# Patient Record
Sex: Female | Born: 1940 | Race: White | Hispanic: No | Smoking: Former smoker
Health system: Southern US, Community
[De-identification: ages and names within clinical notes are randomized; demographics above are authoritative.]

## PROBLEM LIST (undated history)

## (undated) DIAGNOSIS — E079 Disorder of thyroid, unspecified: Secondary | ICD-10-CM

## (undated) HISTORY — PX: ABDOMINAL HYSTERECTOMY: SHX81

## (undated) HISTORY — PX: CATARACT EXTRACTION: SUR2

## (undated) HISTORY — PX: FOOT SURGERY: SHX648

---

## 1997-09-19 ENCOUNTER — Encounter: Admission: RE | Admit: 1997-09-19 | Discharge: 1997-12-18 | Payer: Self-pay | Admitting: Anesthesiology

## 1999-11-17 ENCOUNTER — Encounter: Admission: RE | Admit: 1999-11-17 | Discharge: 1999-12-11 | Payer: Self-pay | Admitting: Anesthesiology

## 2004-09-23 ENCOUNTER — Ambulatory Visit: Payer: Self-pay | Admitting: Internal Medicine

## 2005-09-10 ENCOUNTER — Ambulatory Visit: Payer: Self-pay | Admitting: Internal Medicine

## 2005-11-10 ENCOUNTER — Ambulatory Visit: Payer: Self-pay | Admitting: Internal Medicine

## 2008-07-28 ENCOUNTER — Emergency Department: Payer: Self-pay | Admitting: Internal Medicine

## 2009-10-07 ENCOUNTER — Ambulatory Visit: Payer: Self-pay | Admitting: Internal Medicine

## 2009-10-08 ENCOUNTER — Ambulatory Visit: Payer: Self-pay | Admitting: Internal Medicine

## 2009-10-20 ENCOUNTER — Ambulatory Visit: Payer: Self-pay | Admitting: Internal Medicine

## 2009-11-13 ENCOUNTER — Ambulatory Visit: Payer: Self-pay | Admitting: Unknown Physician Specialty

## 2009-11-14 LAB — PATHOLOGY REPORT

## 2009-11-18 ENCOUNTER — Ambulatory Visit: Payer: Self-pay | Admitting: Internal Medicine

## 2011-01-12 ENCOUNTER — Ambulatory Visit: Payer: Self-pay | Admitting: Specialist

## 2011-01-21 ENCOUNTER — Ambulatory Visit: Payer: Self-pay | Admitting: Specialist

## 2011-12-09 ENCOUNTER — Ambulatory Visit: Payer: Self-pay | Admitting: Unknown Physician Specialty

## 2011-12-13 LAB — PATHOLOGY REPORT

## 2013-10-27 ENCOUNTER — Observation Stay: Payer: Self-pay | Admitting: Internal Medicine

## 2013-10-27 LAB — CBC WITH DIFFERENTIAL/PLATELET
BASOS PCT: 1.4 %
Basophil #: 0.1 10*3/uL (ref 0.0–0.1)
EOS PCT: 4.3 %
Eosinophil #: 0.2 10*3/uL (ref 0.0–0.7)
HCT: 38.4 % (ref 35.0–47.0)
HGB: 13.2 g/dL (ref 12.0–16.0)
LYMPHS ABS: 2 10*3/uL (ref 1.0–3.6)
Lymphocyte %: 35.5 %
MCH: 31.8 pg (ref 26.0–34.0)
MCHC: 34.4 g/dL (ref 32.0–36.0)
MCV: 93 fL (ref 80–100)
MONO ABS: 0.6 x10 3/mm (ref 0.2–0.9)
Monocyte %: 9.8 %
NEUTROS ABS: 2.8 10*3/uL (ref 1.4–6.5)
Neutrophil %: 49 %
Platelet: 241 10*3/uL (ref 150–440)
RBC: 4.15 10*6/uL (ref 3.80–5.20)
RDW: 13.5 % (ref 11.5–14.5)
WBC: 5.6 10*3/uL (ref 3.6–11.0)

## 2013-10-27 LAB — COMPREHENSIVE METABOLIC PANEL
ALBUMIN: 3.7 g/dL (ref 3.4–5.0)
ANION GAP: 5 — AB (ref 7–16)
AST: 20 U/L (ref 15–37)
Alkaline Phosphatase: 64 U/L
BUN: 11 mg/dL (ref 7–18)
Bilirubin,Total: 0.3 mg/dL (ref 0.2–1.0)
CALCIUM: 8.9 mg/dL (ref 8.5–10.1)
CO2: 27 mmol/L (ref 21–32)
Chloride: 111 mmol/L — ABNORMAL HIGH (ref 98–107)
Creatinine: 0.84 mg/dL (ref 0.60–1.30)
EGFR (Non-African Amer.): >60
GLUCOSE: 95 mg/dL (ref 65–99)
Osmolality: 284 (ref 275–301)
Potassium: 3.9 mmol/L (ref 3.5–5.1)
SGPT (ALT): 11 U/L — ABNORMAL LOW
SODIUM: 143 mmol/L (ref 136–145)
TOTAL PROTEIN: 6.9 g/dL (ref 6.4–8.2)

## 2013-10-27 LAB — PROTIME-INR
INR: 1.1
PROTHROMBIN TIME: 13.8 s (ref 11.5–14.7)

## 2013-10-27 LAB — URINALYSIS, COMPLETE
BACTERIA: NONE SEEN
Bilirubin,UR: NEGATIVE
Glucose,UR: NEGATIVE mg/dL (ref 0–75)
KETONE: NEGATIVE
NITRITE: NEGATIVE
PH: 6 (ref 4.5–8.0)
Protein: NEGATIVE
RBC,UR: 1 /HPF (ref 0–5)
SPECIFIC GRAVITY: 1.003 (ref 1.003–1.030)
WBC UR: 3 /HPF (ref 0–5)

## 2013-10-27 LAB — APTT: ACTIVATED PTT: 26.5 s (ref 23.6–35.9)

## 2013-10-27 LAB — DIGOXIN LEVEL: Digoxin: <0.06 ng/mL

## 2013-11-21 ENCOUNTER — Ambulatory Visit: Payer: Self-pay | Admitting: Internal Medicine

## 2014-07-20 NOTE — H&P (Signed)
PATIENT NAME:  [name], [name] DATE OF BIRTH:  07/02/1940  DATE OF ADMISSION:  10/27/2013  REFERRING PHYSICIAN: Dorothea GlassmanPaul Soto.   PRIMARY CARE PHYSICIAN: [name].   CHIEF COMPLAINT: Weakness.   HISTORY OF PRESENT ILLNESS: A 74 year old Caucasian female with history of hypothyroidism, GERD, presenting with weakness. Describes acute onset of weakness after watching TV. She states "I felt funny in my head, went to bed, and then I felt funny in my feet too".  At that time she did notice some right-sided weakness with difficulty with repositioning herself in bed, so she called for her son. He noticed her having some slurred speech. All these symptoms have resolved prior to arrival to the Emergency Department. Currently no other complaints. She states she is now in her usual state of health and she was in her usual state of health prior to the onset of the above symptoms.  REVIEW OF SYSTEMS:  CONSTITUTIONAL: Denies fever, fatigue, weakness other than weakness as described above.  EYES: Denies blurred vision, double vision, eye pain.  EARS, NOSE, THROAT: Denies any tinnitus, ear pain or hearing loss. RESPIRATORY: Denies cough, wheeze or shortness of breath.  CARDIOVASCULAR: Denies chest pain, palpitations or edema.  GASTROINTESTINAL: Denies nausea, vomiting, diarrhea, abdominal pain.  GENITOURINARY: Denies dysuria, hematuria.  ENDOCRINE: Denies nocturia or thyroid problems.  HEMATOLOGIC AND LYMPHATIC: Denies easy bruising or bleeding.  SKIN: Denies rash or lesions.  MUSCULOSKELETAL: Denies pain in neck, back, shoulder, knees, hips or arthritic symptoms.  NEUROLOGIC: Positive for paresthesias bilateral feet as well as right-sided weakness as described above and slurred speech. Denies any headache.   PSYCHIATRIC: No anxiety or depressive symptoms.  Otherwise, full review of systems by me is negative.     PAST MEDICAL HISTORY: Hypothyroidism, as well as GERD and Barrett's  esophagitis.   SOCIAL HISTORY: Positive for tobacco use. Denies any alcohol or drug usage.   FAMILY HISTORY: Positive for coronary artery disease.   ALLERGIES: No known drug allergies.   HOME MEDICATIONS: Include Synthroid 100 mcg p.o. daily.   PHYSICAL EXAMINATION:  VITAL SIGNS: Temperature 98.1, heart rate 90, respirations 14, blood pressure 151/65, saturating 96% on room air. Weight 52.8 kg, BMI 20.  GENERAL: Weak-appearing Caucasian female, currently in no acute distress.  HEAD: Normocephalic, atraumatic.  EYES: Pupils equal, round and reactive to light. Extraocular movements are intact.  No scleral icterus.  MOUTH: Moist mucosal membrane. Dentition intact. No abscess noted.  EARS, NOSE AND THROAT: Clear without exudates. No external lesions.  NECK: Supple. No thyromegaly. No nodules. No JVD.  PULMONARY: Clear to auscultation bilaterally without wheezes or rhonchi. No use of accessory muscles. Good respiratory effort.  CHEST: Nontender to palpation.  CARDIOVASCULAR: S1, S2, regular rate and rhythm. No murmurs, rubs, or gallops. No edema. Pedal pulses 2+ bilaterally.  GASTROINTESTINAL: Abdomen soft, nontender, nondistended. No masses. Positive bowel sounds. No hepatosplenomegaly.  MUSCULOSKELETAL: No swelling, clubbing, or edema. Range of motion full in all extremities.  NEUROLOGIC: Cranial nerves II through XII intact. No gross focal neurological deficits. Sensation intact. Reflexes intact. Strength 5/5 in all extremities, including proximal and distal flexion and extension. Pronator drift within normal limits. Gait deferred at this time.  SKIN: No ulceration, lesions, rashes, or cyanosis. Skin warm, dry. Turgor intact.  PSYCHIATRIC: Mood and affect within normal limits. The patient is awake, alert, oriented x 3. Insight and judgment intact.   LABORATORY DATA: CT head performed revealing no acute intracranial process. There is, however, evidence of mild  cerebral atrophy and 7 mm  calcification at the medial aspect of the right sphenoid, likely representing a small meningioma. 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 No acute cardiopulmonary process. Remainder of laboratory data: Sodium 143, potassium 3.9, chloride 111, bicarbonate 27, BUN 11, creatinine 0.84, glucose 95. LFTs within normal limits. WBC 5.6, hemoglobin 13.2, platelets of 241,000.   ASSESSMENT AND PLAN: A 74 year old Caucasian female with history of hypothyroidism, as well as gastroesophageal reflux disease, presenting with weakness.  1. Transient ischemic attack. Admit her to telemetry under observational status. Initiate aspirin and statin therapy, neurology checks q. 4 hours. Check lipid panel, MRI, transthoracic echocardiogram as well as carotid Dopplers in searching for etiology of symptoms and risk factor modification.  2. Hypothyroidism. Continue with home dose of Synthroid.  3. Venous thromboembolism prophylaxis with sequential compression devices.  CODE STATUS: The patient is a full code.   TIME SPENT: 45 minutes.    ____________________________ Virginia Athens. Hower, MD dkh:jh D: 10/27/2013 01:43:25 ET T: 10/27/2013 02:19:13 ET JOB#: [id number]  cc: Virginia Athens. Hower, MD, <Dictator> Virginia Synetta Shadow MD ELECTRONICALLY SIGNED 10/27/2013 20:26

## 2014-07-20 NOTE — Discharge Summary (Signed)
PATIENT NAME:  [name], Virginia L MR#:  [id number] DATE OF BIRTH:  07/02/1940  DATE OF ADMISSION:  10/27/2013 DATE OF DISCHARGE:  10/27/2013  PRIMARY CARE PHYSICIAN: Katherina Rightenny C. Arlana Pouchate, MD.  DISCHARGE DIAGNOSES: 1.  Transient ischemic attack. 2.  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 3.  Tobacco abuse.  4.  Hypothyroidism.   CONDITION: Stable.   CODE STATUS: Full code.   HOME MEDICATIONS: Please refer to the medication reconciliation list.   DIET: Low-fat, low-cholesterol diet.   ACTIVITY: As tolerated.   FOLLOWUP CARE:  Follow up with PCP in 1-2 weeks. The patient needed to get a brain MRI to rule out CVA.  The patient needs to check lipid panel with PCP.   REASON FOR ADMISSION: Weakness.   HOSPITAL COURSE: The patient is a 74 year old Caucasian female with a history of hypothyroidism, GERD, presented to the ED with weakness.  For detailed history and physical examination, please refer to the admission note dictated by Dr. Angelica Ranavid Hower.   On admission date, the patient's CAT scan of head did not show any acute intracranial process, but there is a 7 mm calcification at medial aspect of right sphenoid, likely representing a small meningioma. Chest x-ray shows COPD and emphysema. Laboratory data was unremarkable.  The patient was admitted for TIA to rule out CVA.  After admission, the patient has been treated with aspirin and a statin. The patient got a carotid duplex, which showed bilateral ICA stenosis  less than 50%. The patient refused to get an MRI due to anxiety. The patient wants to go home today. I discussed the patient's condition and plan of treatment, but the patient still wants to go home today.  Her son wants to  get an open MRI in other hospital as outpatient. The patient will get an echo today and then she will be discharged to home today.   The patient has no complaints. She wants to go home. Her vital signs are stable. Discussed the patient's discharge plan  with the patient and the patient's son; otherwise, then to follow up lipid panel with PCP and get MRI of brain, to rule out CVA and continue aspirin and statin.  Discussed with nurse.   TIME SPENT: About 43 minutes.   ____________________________ Shaune PollackQing Najee Cowens, MD qc:ds D: 10/27/2013 13:11:17 ET T: 10/27/2013 16:02:42 ET JOB#: [id number]  cc: Shaune PollackQing Scotty Weigelt, MD, <Dictator> Shaune PollackQING Jeromey Kruer MD ELECTRONICALLY SIGNED 10/27/2013 18:43

## 2016-10-18 ENCOUNTER — Other Ambulatory Visit: Payer: Self-pay | Admitting: Internal Medicine

## 2016-10-18 DIAGNOSIS — R1030 Lower abdominal pain, unspecified: Secondary | ICD-10-CM

## 2016-10-18 DIAGNOSIS — Z90710 Acquired absence of both cervix and uterus: Secondary | ICD-10-CM

## 2016-10-21 ENCOUNTER — Ambulatory Visit
Admission: RE | Admit: 2016-10-21 | Discharge: 2016-10-21 | Disposition: A | Discharge status: Home / Self Care | Payer: Medicare Other | Source: Ambulatory Visit | Attending: Internal Medicine | Admitting: Internal Medicine

## 2016-10-21 DIAGNOSIS — Z90710 Acquired absence of both cervix and uterus: Secondary | ICD-10-CM

## 2016-10-21 DIAGNOSIS — R1030 Lower abdominal pain, unspecified: Secondary | ICD-10-CM | POA: Diagnosis present

## 2016-10-26 ENCOUNTER — Encounter: Payer: Self-pay | Admitting: Emergency Medicine

## 2016-10-26 ENCOUNTER — Emergency Department
Admission: EM | Admit: 2016-10-26 | Discharge: 2016-10-26 | Disposition: A | Discharge status: Home / Self Care | Payer: Medicare Other | Attending: Emergency Medicine | Admitting: Emergency Medicine

## 2016-10-26 DIAGNOSIS — Z79899 Other long term (current) drug therapy: Secondary | ICD-10-CM | POA: Insufficient documentation

## 2016-10-26 DIAGNOSIS — N952 Postmenopausal atrophic vaginitis: Secondary | ICD-10-CM

## 2016-10-26 DIAGNOSIS — R300 Dysuria: Secondary | ICD-10-CM

## 2016-10-26 DIAGNOSIS — F17210 Nicotine dependence, cigarettes, uncomplicated: Secondary | ICD-10-CM | POA: Diagnosis not present

## 2016-10-26 DIAGNOSIS — E039 Hypothyroidism, unspecified: Secondary | ICD-10-CM | POA: Diagnosis not present

## 2016-10-26 HISTORY — DX: Disorder of thyroid, unspecified: E07.9

## 2016-10-26 LAB — URINALYSIS, COMPLETE (UACMP) WITH MICROSCOPIC
Bacteria, UA: NONE SEEN
Bilirubin Urine: NEGATIVE
GLUCOSE, UA: NEGATIVE mg/dL
HGB URINE DIPSTICK: NEGATIVE
Ketones, ur: NEGATIVE mg/dL
NITRITE: NEGATIVE
PH: 5 (ref 5.0–8.0)
Protein, ur: NEGATIVE mg/dL
SPECIFIC GRAVITY, URINE: 1.019 (ref 1.005–1.030)

## 2016-10-26 MED ORDER — ESTRADIOL 10 MCG VA TABS: 1.0000 | ORAL_TABLET | Freq: Every day | VAGINAL | 0 refills | Status: AC

## 2016-10-26 MED ORDER — PHENAZOPYRIDINE HCL 95 MG PO TABS: 95.0000 mg | ORAL_TABLET | Freq: Three times a day (TID) | ORAL | 0 refills | Status: DC | PRN

## 2016-10-26 NOTE — ED Triage Notes (Signed)
Pt with low pelvic and back pain, recently treated for UTI and states is not any better. Has finished antibiotics.

## 2016-10-26 NOTE — ED Provider Notes (Signed)
[facility] Emergency Department Provider Note  ____________________________________________  Time seen: Approximately 3:27 PM  I have reviewed the triage vital signs and the nursing notes.   HISTORY  Chief Complaint Urinary Tract Infection   HPI Rayford HalstedCarole L Nobles is a 76 y.o. female with a history of hypothyroidism who presents for evaluation of dysuria and frequency. Patient reports that she's been having these symptoms ongoing for 3 weeks. 3 weeks ago she went to urgent care and was put on antibiotics. Unfortunately she does not remember which one and we are unable to see the results of her urine culture. She reports that her symptoms never got better. She went to see her primary care doctor last week and he sent her for a transvaginal ultrasound which was normal. No repeat urinalysis was done at that time. Patient continues to have dysuria and frequency. She denies abdominal pain, flank pain, fever or chills, nausea vomiting.  Past Medical History:  Diagnosis Date  . Thyroid disease     There are no active problems to display for this patient.   Past Surgical History:  Procedure Laterality Date  . ABDOMINAL HYSTERECTOMY      Prior to Admission medications   Medication Sig Start Date End Date Taking? Authorizing Provider  Estradiol 10 MCG TABS vaginal tablet Place 1 tablet (10 mcg total) vaginally daily. 10/26/16 11/09/16  Nita SickleVeronese, [place], MD  phenazopyridine (PYRIDIUM) 95 MG tablet Take 1 tablet (95 mg total) by mouth 3 (three) times daily as needed for pain. 10/26/16   Nita SickleVeronese, [place], MD    Allergies Patient has no known allergies.  No family history on file.  Social History Social History  Substance Use Topics  . Smoking status: Current Every Day Smoker  . Smokeless tobacco: Never Used  . Alcohol use Yes    Review of Systems  Constitutional: Negative for fever. Eyes: Negative for visual changes. ENT: Negative for sore  throat. Neck: No neck pain  Cardiovascular: Negative for chest pain. Respiratory: Negative for shortness of breath. Gastrointestinal: Negative for abdominal pain, vomiting or diarrhea. Genitourinary: + dysuria and frequency Musculoskeletal: Negative for back pain. Skin: Negative for rash. Neurological: Negative for headaches, weakness or numbness. Psych: No SI or HI  ____________________________________________   PHYSICAL EXAM:  VITAL SIGNS: ED Triage Vitals  Enc Vitals Group     BP 10/26/16 1502 121/67     Pulse Rate 10/26/16 1502 75     Resp 10/26/16 1502 18     Temp 10/26/16 1502 98.1 F (36.7 C)     Temp Source 10/26/16 1502 Oral     SpO2 10/26/16 1502 97 %     Weight 10/26/16 1503 118 lb (53.5 kg)     Height --      Head Circumference --      Peak Flow --      Pain Score 10/26/16 1502 8     Pain Loc --      Pain Edu? --      Excl. in GC? --     Constitutional: Alert and oriented. Well appearing and in no apparent distress. HEENT:      Head: Normocephalic and atraumatic.         Eyes: Conjunctivae are normal. Sclera is non-icteric.       Mouth/Throat: Mucous membranes are moist.       Neck: Supple with no signs of meningismus. Cardiovascular: Regular rate and rhythm. No murmurs, gallops, or rubs. 2+ symmetrical distal pulses are present  in all extremities. No JVD. Respiratory: Normal respiratory effort. Lungs are clear to auscultation bilaterally. No wheezes, crackles, or rhonchi.  Gastrointestinal: Soft, non tender, and non distended with positive bowel sounds. No rebound or guarding. Genitourinary: No CVA tenderness. Musculoskeletal: Nontender with normal range of motion in all extremities. No edema, cyanosis, or erythema of extremities. Neurologic: Normal speech and language. Face is symmetric. Moving all extremities. No gross focal neurologic deficits are appreciated. Skin: Skin is warm, dry and intact. No rash noted. Psychiatric: Mood and affect are normal.  Speech and behavior are normal.  ____________________________________________   LABS (all labs ordered are listed, but only abnormal results are displayed)  Labs Reviewed  URINALYSIS, COMPLETE (UACMP) WITH MICROSCOPIC - Abnormal; Notable for the following:       Result Value   Color, Urine YELLOW (*)    APPearance CLEAR (*)    Leukocytes, UA TRACE (*)    Squamous Epithelial / LPF 0-5 (*)    All other components within normal limits  URINE CULTURE   ____________________________________________  EKG  none  ____________________________________________  RADIOLOGY   none ____________________________________________   PROCEDURES  Procedure(s) performed: None Procedures Critical Care performed:  None ____________________________________________   INITIAL IMPRESSION / ASSESSMENT AND PLAN / ED COURSE  76 y.o. female with a history of hypothyroidism who presents for evaluation of dysuria and frequency x 3 weeks. Patient has no systemic signs of pyelonephritis. She is extremely well appearing with normal vital signs, no flank tenderness, abdomen is soft with no tenderness throughout. We'll repeat a urinalysis at this time and sent for culture.  _________________________ 3:44 PM on 10/26/2016 -----------------------------------------  UA showing no nitrites, no bacteria, no WBCs. Just trace leukesterase. Urine culture is pending. I do believe the patient's presentation at this time is due to atrophic vaginitis. We'll start patient on a vaginal estrogen, a so, and referral to OB/GYN. Patient refused a vaginal exam at this time but will f/u with Obgyn.     Pertinent labs & imaging results that were available during my care of the patient were reviewed by me and considered in my medical decision making (see chart for details).    ____________________________________________   FINAL CLINICAL IMPRESSION(S) / ED DIAGNOSES  Final diagnoses:  Dysuria  Atrophic vaginitis       NEW MEDICATIONS STARTED DURING THIS VISIT:  New Prescriptions   ESTRADIOL 10 MCG TABS VAGINAL TABLET    Place 1 tablet (10 mcg total) vaginally daily.   PHENAZOPYRIDINE (PYRIDIUM) 95 MG TABLET    Take 1 tablet (95 mg total) by mouth 3 (three) times daily as needed for pain.     Note:  This document was prepared using Dragon voice recognition software and may include unintentional dictation errors.    Don PerkingVeronese, [place], MD 10/26/16 [phone]

## 2016-10-26 NOTE — ED Notes (Signed)
Pt discharged home after verbalizing understanding of discharge instructions; nad noted. 

## 2016-10-26 NOTE — ED Notes (Signed)
Pt presents with continuing UTI symptoms; see triage note. Pt alert & oriented, ready to go. Very anxious to get out of the room.

## 2016-10-28 LAB — URINE CULTURE

## 2016-11-30 ENCOUNTER — Other Ambulatory Visit: Payer: Self-pay | Admitting: Internal Medicine

## 2016-11-30 DIAGNOSIS — R1084 Generalized abdominal pain: Secondary | ICD-10-CM

## 2016-12-03 ENCOUNTER — Ambulatory Visit
Admission: RE | Admit: 2016-12-03 | Discharge: 2016-12-03 | Disposition: A | Discharge status: Home / Self Care | Payer: Medicare Other | Source: Ambulatory Visit | Attending: Internal Medicine | Admitting: Internal Medicine

## 2016-12-03 DIAGNOSIS — R109 Unspecified abdominal pain: Secondary | ICD-10-CM | POA: Diagnosis present

## 2016-12-03 DIAGNOSIS — R1084 Generalized abdominal pain: Secondary | ICD-10-CM

## 2017-08-13 ENCOUNTER — Emergency Department: Payer: Medicare Other

## 2017-08-13 ENCOUNTER — Emergency Department
Admission: EM | Admit: 2017-08-13 | Discharge: 2017-08-14 | Disposition: A | Discharge status: Home / Self Care | Payer: Medicare Other | Attending: Emergency Medicine | Admitting: Emergency Medicine

## 2017-08-13 ENCOUNTER — Other Ambulatory Visit: Payer: Self-pay

## 2017-08-13 DIAGNOSIS — H9202 Otalgia, left ear: Secondary | ICD-10-CM | POA: Insufficient documentation

## 2017-08-13 DIAGNOSIS — R410 Disorientation, unspecified: Secondary | ICD-10-CM | POA: Diagnosis not present

## 2017-08-13 DIAGNOSIS — F17210 Nicotine dependence, cigarettes, uncomplicated: Secondary | ICD-10-CM | POA: Diagnosis not present

## 2017-08-13 DIAGNOSIS — R51 Headache: Secondary | ICD-10-CM | POA: Insufficient documentation

## 2017-08-13 DIAGNOSIS — J029 Acute pharyngitis, unspecified: Secondary | ICD-10-CM | POA: Diagnosis not present

## 2017-08-13 LAB — GROUP A STREP BY PCR: GROUP A STREP BY PCR: NOT DETECTED

## 2017-08-13 MED ORDER — SODIUM CHLORIDE 0.9 % IV BOLUS
500.0000 mL | Freq: Once | INTRAVENOUS | Status: AC
  Administered 2017-08-13: 500 mL via INTRAVENOUS

## 2017-08-13 MED ORDER — AMOXICILLIN-POT CLAVULANATE 875-125 MG PO TABS: 1.0000 | ORAL_TABLET | Freq: Once | ORAL | Status: DC

## 2017-08-13 NOTE — ED Triage Notes (Signed)
Patient reports left ear pain that radiates down into left jaw.  Reports started earlier today.

## 2017-08-14 DIAGNOSIS — H9202 Otalgia, left ear: Secondary | ICD-10-CM | POA: Diagnosis not present

## 2017-08-14 LAB — URINALYSIS, COMPLETE (UACMP) WITH MICROSCOPIC
BILIRUBIN URINE: NEGATIVE
Bacteria, UA: NONE SEEN
GLUCOSE, UA: NEGATIVE mg/dL
KETONES UR: NEGATIVE mg/dL
LEUKOCYTES UA: NEGATIVE
NITRITE: NEGATIVE
Protein, ur: NEGATIVE mg/dL
SPECIFIC GRAVITY, URINE: 1.002 — AB (ref 1.005–1.030)
pH: 7 (ref 5.0–8.0)

## 2017-08-14 LAB — COMPREHENSIVE METABOLIC PANEL
ALBUMIN: 4.1 g/dL (ref 3.5–5.0)
ALK PHOS: 67 U/L (ref 38–126)
ALT: 9 U/L — ABNORMAL LOW (ref 14–54)
ANION GAP: 9 (ref 5–15)
AST: 6 U/L — ABNORMAL LOW (ref 15–41)
BUN: 18 mg/dL (ref 6–20)
CALCIUM: 9.4 mg/dL (ref 8.9–10.3)
CO2: 22 mmol/L (ref 22–32)
Chloride: 103 mmol/L (ref 101–111)
Creatinine, Ser: 0.83 mg/dL (ref 0.44–1.00)
GFR calc Af Amer: >60 mL/min (ref >60)
GFR calc non Af Amer: >60 mL/min (ref >60)
Glucose, Bld: 118 mg/dL — ABNORMAL HIGH (ref 65–99)
POTASSIUM: 3.6 mmol/L (ref 3.5–5.1)
SODIUM: 134 mmol/L — AB (ref 135–145)
TOTAL PROTEIN: 6.6 g/dL (ref 6.5–8.1)
Total Bilirubin: 0.8 mg/dL (ref 0.3–1.2)

## 2017-08-14 LAB — TROPONIN I: Troponin I: <0.03 ng/mL (ref <0.03)

## 2017-08-14 LAB — CBC
HCT: 36.4 % (ref 35.0–47.0)
Hemoglobin: 12.5 g/dL (ref 12.0–16.0)
MCH: 31.4 pg (ref 26.0–34.0)
MCHC: 34.4 g/dL (ref 32.0–36.0)
MCV: 91.2 fL (ref 80.0–100.0)
PLATELETS: 276 10*3/uL (ref 150–440)
RBC: 3.99 MIL/uL (ref 3.80–5.20)
RDW: 14.3 % (ref 11.5–14.5)
WBC: 7.7 10*3/uL (ref 3.6–11.0)

## 2017-08-14 LAB — CK: CK TOTAL: 33 U/L — AB (ref 38–234)

## 2017-08-14 MED ORDER — AZITHROMYCIN 500 MG PO TABS
500.0000 mg | ORAL_TABLET | Freq: Once | ORAL | Status: AC
  Administered 2017-08-14: 500 mg via ORAL
  Filled 2017-08-14: qty 1

## 2017-08-14 MED ORDER — LIDOCAINE HCL (PF) 1 % IJ SOLN
2.0000 mL | Freq: Once | INTRAMUSCULAR | Status: AC
  Administered 2017-08-14: 2 mL
  Filled 2017-08-14: qty 5

## 2017-08-14 MED ORDER — ACETAMINOPHEN 325 MG PO TABS
650.0000 mg | ORAL_TABLET | Freq: Once | ORAL | Status: AC
  Administered 2017-08-14: 650 mg via ORAL
  Filled 2017-08-14: qty 2

## 2017-08-14 MED ORDER — AZITHROMYCIN 250 MG PO TABS: 250.0000 mg | ORAL_TABLET | Freq: Every day | ORAL | 0 refills | Status: DC

## 2017-08-14 MED ORDER — LIDOCAINE VISCOUS HCL 2 % MT SOLN
15.0000 mL | Freq: Once | OROMUCOSAL | Status: AC
  Administered 2017-08-14: 15 mL via OROMUCOSAL
  Filled 2017-08-14: qty 15

## 2017-08-14 NOTE — ED Provider Notes (Addendum)
[facility] Emergency Department Provider Note  ____________________________________________  Time seen: Approximately 12:05 AM  I have reviewed the triage vital signs and the nursing notes.   HISTORY  Chief Complaint Otalgia    HPI [name] is a 77 y.o. female that presents to the emergency department for evaluation of left sided headache, ear pain, jaw pain, difficulty swallowing since this morning.  Two family members have been sick this week with strep throat.  She denies fever, confusion, cough, shortness of breath, chest pain, nausea, vomiting, dysuria.   Past Medical History:  Diagnosis Date  . Thyroid disease     There are no active problems to display for this patient.   Past Surgical History:  Procedure Laterality Date  . ABDOMINAL HYSTERECTOMY      Prior to Admission medications   Medication Sig Start Date End Date Taking? Authorizing Provider  phenazopyridine (PYRIDIUM) 95 MG tablet Take 1 tablet (95 mg total) by mouth 3 (three) times daily as needed for pain. 10/26/16   Nita Sickle, MD    Allergies Patient has no known allergies.  No family history on file.  Social History Social History   Tobacco Use  . Smoking status: Current Every Day Smoker  . Smokeless tobacco: Never Used  Substance Use Topics  . Alcohol use: Yes  . Drug use: No     Review of Systems  Constitutional: No fever/chills ENT: Positive for ear pain. Cardiovascular: No chest pain. Respiratory: No cough. No SOB. Gastrointestinal: No abdominal pain.  No nausea, no vomiting.  Genitourinary: Negative for dysuria. Musculoskeletal: Negative for musculoskeletal pain. Skin: Negative for rash, abrasions, lacerations, ecchymosis. Neurological: Negative for headaches, numbness or tingling   ____________________________________________   PHYSICAL EXAM:  VITAL SIGNS: ED Triage Vitals  Enc Vitals Group     BP 08/13/17 2145 (!) 157/102   Pulse Rate 08/13/17 2145 76     Resp 08/13/17 2145 18     Temp 08/13/17 2145 98.5 F (36.9 C)     Temp Source 08/13/17 2145 Oral     SpO2 08/13/17 2145 96 %     Weight 08/13/17 2146 105 lb (47.6 kg)     Height 08/13/17 2146  (1.6 m)     Head Circumference --      Peak Flow --      Pain Score 08/13/17 2159 6     Pain Loc --      Pain Edu? --      Excl. in GC? --      Constitutional: Alert. Well appearing and in no acute distress. Eyes: Conjunctivae are normal. PERRL. EOMI. Head: Atraumatic. ENT:      Ears: Tympanic membranes are pearly.      Nose: No congestion/rhinnorhea.      Mouth/Throat: Mucous membranes are moist.  Oropharynx erythematous.  Tonsils not enlarged.  Uvula midline. Neck: No stridor.  No bruits. Cardiovascular: Normal rate, regular rhythm.  Good peripheral circulation. Respiratory: Normal respiratory effort without tachypnea or retractions.  Scattered wheezes. Good air entry to the bases with no decreased or absent breath sounds. Gastrointestinal: Bowel sounds 4 quadrants. Soft and nontender to palpation. No guarding or rigidity. No palpable masses. No distention.  Musculoskeletal: Full range of motion to all extremities. No gross deformities appreciated. Neurologic:  Normal speech and language. No gross focal neurologic deficits are appreciated.  Skin:  Skin is warm, dry and intact. No rash noted. Psychiatric: Mood and affect are normal. Patient is repeating stories and  questions multiple times.    ____________________________________________   LABS (all labs ordered are listed, but only abnormal results are displayed)  Labs Reviewed  GROUP A STREP BY PCR  CBC  COMPREHENSIVE METABOLIC PANEL  TROPONIN I  CK  URINALYSIS, COMPLETE (UACMP) WITH MICROSCOPIC   ____________________________________________  EKG  NSR ____________________________________________  RADIOLOGY Lexine Baton, personally viewed and evaluated these images (plain  radiographs) as part of my medical decision making, as well as reviewing the written report by the radiologist.  Dg Chest 2 View  Result Date: 08/13/2017 CLINICAL DATA:  Confusion EXAM: CHEST - 2 VIEW COMPARISON:  10/27/2013 FINDINGS: Hyperinflation. Heart and mediastinal contours are within normal limits. No focal opacities or effusions. No acute bony abnormality. IMPRESSION: Hyperinflation/COPD.  No active cardiopulmonary disease. Electronically Signed   By: Charlett Nose M.D.   On: 08/13/2017 23:37   Ct Head Wo Contrast  Result Date: 08/13/2017 CLINICAL DATA:  Acute onset of left ear pain, radiating to the left jaw. Confusion. EXAM: CT HEAD WITHOUT CONTRAST TECHNIQUE: Contiguous axial images were obtained from the base of the skull through the vertex without intravenous contrast. COMPARISON:  CT of the head performed 10/27/2013, and MRI of the brain performed 11/21/2013 FINDINGS: Brain: No evidence of acute infarction, hemorrhage, hydrocephalus, extra-axial collection or mass lesion / mass effect. Prominence of the ventricles and sulci reflects mild cortical volume loss. The brainstem and fourth ventricle are within normal limits. The basal ganglia are unremarkable in appearance. The cerebral hemispheres demonstrate grossly normal gray-white differentiation. No mass effect or midline shift is seen. Vascular: No hyperdense vessel or unexpected calcification. Skull: There is no evidence of fracture; visualized osseous structures are unremarkable in appearance. Sinuses/Orbits: The visualized portions of the orbits are within normal limits. The paranasal sinuses and mastoid air cells are well-aerated. Other: No significant soft tissue abnormalities are seen. IMPRESSION: 1. No acute intracranial pathology seen on CT. 2. Mild cortical volume loss noted. Electronically Signed   By: Roanna Raider M.D.   On: 08/13/2017 23:36    ____________________________________________    PROCEDURES  Procedure(s)  performed:    Procedures    Medications  sodium chloride 0.9 % bolus 500 mL (500 mLs Intravenous New Bag/Given 08/13/17 2357)     ____________________________________________   INITIAL IMPRESSION / ASSESSMENT AND PLAN / ED COURSE  Pertinent labs & imaging results that were available during my care of the patient were reviewed by me and considered in my medical decision making (see chart for details).  Review of the [place] CSRS was performed in accordance of the [facility] prior to dispensing any controlled drugs.   Patient presents to emergency department for evaluation of left-sided headache, ear pain, jaw pain, painful swallowing for 1 day.  On examination of patient, she repeated questions and statements multiple times. She was not able to repeat back to me our discussions. Son denies any history of dementia or Alzheimer's.  He has not noticed a change in behavior today. Health tech [place] commented that the stories she was telling did not make sense.  CT head, chest x-ray, blood work, urinalysis, strep test was ordered. Patient will be transferred to main for pending tests. Report was given to Dr. Dolores Frame.      ____________________________________________  FINAL CLINICAL IMPRESSION(S) / ED DIAGNOSES  Final diagnoses:  None      NEW MEDICATIONS STARTED DURING THIS VISIT:  ED Discharge Orders    None          This chart was  dictated using voice recognition software/Dragon. Despite best efforts to proofread, errors can occur which can change the meaning. Any change was purely unintentional.    Enid Derry, PA-C 08/14/17 0014    Enid Derry, PA-C 08/14/17 0015

## 2017-08-14 NOTE — Discharge Instructions (Addendum)
1.  Finish antibiotic as prescribed (Azithromycin 250 mg daily x 4 days).  Start your next dose Monday morning. 2.  You may take Tylenol and/or Ibuprofen as needed for discomfort. 3.  Return to the ER for worsening symptoms, persistent vomiting, difficulty breathing or other concerns.

## 2017-08-14 NOTE — ED Provider Notes (Addendum)
-----------------------------------------   1:21 AM on 08/14/2017 -----------------------------------------  Medical screening examination/treatment/procedure(s) were conducted as a shared visit with non-physician practitioner(s) and myself.  I personally evaluated the patient during the encounter. See below.   Updated patient and son on all test results.  Son does not see a change in patient's mentation today.  He is not concerned about her short-term memory loss, neither is she and patient is eager for discharge.  Examined patient's ears; right TM with some cerumen but otherwise within normal limits.  Left TM with mild fluid and white material which looks like scar tissue.  She tells me she only recently started cleaning her ears with Q-tips.  Pain is better in her throat from oral lidocaine.  Will apply 2 drops lidocaine to her left ear for further analgesia, start her on azithromycin for both ear and throat pain.  We discussed risk/benefits of starting her on steroids for eustachian tube dysfunction; they both agreed to hold as I am afraid the risks to her would outweigh the benefits.  Strict return precautions given.  Both verbalize understanding and agree with plan of care.   Irean Hong, MD 08/14/17 1610    Irean Hong, MD 08/21/17 [phone]

## 2017-09-22 ENCOUNTER — Emergency Department
Admission: EM | Admit: 2017-09-22 | Discharge: 2017-09-22 | Disposition: A | Discharge status: Home / Self Care | Payer: Medicare Other | Attending: Emergency Medicine | Admitting: Emergency Medicine

## 2017-09-22 ENCOUNTER — Encounter: Payer: Self-pay | Admitting: Emergency Medicine

## 2017-09-22 ENCOUNTER — Other Ambulatory Visit: Payer: Self-pay

## 2017-09-22 DIAGNOSIS — H6501 Acute serous otitis media, right ear: Secondary | ICD-10-CM | POA: Diagnosis not present

## 2017-09-22 DIAGNOSIS — F17200 Nicotine dependence, unspecified, uncomplicated: Secondary | ICD-10-CM | POA: Diagnosis not present

## 2017-09-22 DIAGNOSIS — H9201 Otalgia, right ear: Secondary | ICD-10-CM | POA: Diagnosis present

## 2017-09-22 MED ORDER — LIDOCAINE VISCOUS HCL 2 % MT SOLN
15.0000 mL | Freq: Once | OROMUCOSAL | Status: AC
  Administered 2017-09-22: 15 mL via OROMUCOSAL
  Filled 2017-09-22: qty 15

## 2017-09-22 MED ORDER — AMOXICILLIN 500 MG PO TABS: 1000.0000 mg | ORAL_TABLET | Freq: Two times a day (BID) | ORAL | 0 refills | Status: AC

## 2017-09-22 NOTE — ED Triage Notes (Signed)
Patient to ER for c/o right sided ear ache. States she was seen in ER recently and given antibiotic for the same on left side. Denies any dizziness or fever.

## 2017-09-22 NOTE — ED Provider Notes (Signed)
[facility] Emergency Department Provider Note  ____________________________________________   First MD Initiated Contact with Patient 09/22/17 [id number]     (approximate)  I have reviewed the triage vital signs and the nursing notes.   HISTORY  Chief Complaint Otalgia   HPI [name] is a 77 y.o. female who self presents to the emergency department with 1 day of right-sided earache.  The pain is mild to moderate throbbing and aching in her right ear.  No fevers or chills.  No neck pain.  No headache.  No discharge.  No trauma.  She did have a possible otitis media in her left ear about a month ago along with a sore throat and was treated with azithromycin.  Nothing seems to make the symptoms better or worse at this point.  Past Medical History:  Diagnosis Date  . Thyroid disease     There are no active problems to display for this patient.   Past Surgical History:  Procedure Laterality Date  . ABDOMINAL HYSTERECTOMY      Prior to Admission medications   Medication Sig Start Date End Date Taking? Authorizing Provider  amoxicillin (AMOXIL) 500 MG tablet Take 2 tablets (1,000 mg total) by mouth 2 (two) times daily for 7 days. 09/22/17 09/29/17  Merrily Brittleifenbark, Kitrina Maurin, MD  azithromycin (ZITHROMAX) 250 MG tablet Take 1 tablet (250 mg total) by mouth daily. 08/14/17   Irean HongSung, Jade J, MD  phenazopyridine (PYRIDIUM) 95 MG tablet Take 1 tablet (95 mg total) by mouth 3 (three) times daily as needed for pain. 10/26/16   Nita SickleVeronese, Snyder, MD    Allergies Patient has no known allergies.  No family history on file.  Social History Social History   Tobacco Use  . Smoking status: Current Every Day Smoker  . Smokeless tobacco: Never Used  Substance Use Topics  . Alcohol use: Yes  . Drug use: No    Review of Systems Constitutional: No fever/chills ENT: Positive for ear pain Cardiovascular: Denies chest pain. Respiratory: Denies shortness of  breath. Gastrointestinal: No abdominal pain.  No nausea, no vomiting.   Neurological: Negative for headaches   ____________________________________________   PHYSICAL EXAM:  VITAL SIGNS: ED Triage Vitals  Enc Vitals Group     BP 09/22/17 0547 (!) 150/64     Pulse Rate 09/22/17 0547 86     Resp 09/22/17 0547 17     Temp 09/22/17 0547 98 F (36.7 C)     Temp Source 09/22/17 0547 Oral     SpO2 09/22/17 0547 96 %     Weight 09/22/17 0549 105 lb (47.6 kg)     Height 09/22/17 0549 5\' 4"  (1.626 m)     Head Circumference --      Peak Flow --      Pain Score --      Pain Loc --      Pain Edu? --      Excl. in GC? --     Constitutional: Pleasant cooperative no acute distress Head: Left-sided tympanic membrane normal right-sided tympanic membrane with slight serous discharge no erythema or bulging.  No mastoid tenderness. Nose: No congestion/rhinnorhea. Mouth/Throat: No trismus no meningismus Neck: No stridor.   Cardiovascular: Regular rate and rhythm Respiratory: Normal respiratory effort.  No retractions. Neurologic:  Normal speech and language. No gross focal neurologic deficits are appreciated.  Skin:  Skin is warm, dry and intact. No rash noted.    ____________________________________________  LABS (all labs ordered are listed, but  only abnormal results are displayed)  Labs Reviewed - No data to display   __________________________________________  EKG   ____________________________________________  RADIOLOGY   ____________________________________________   DIFFERENTIAL includes but not limited to  Acute otitis media, acute otitis externa, meningitis, mastoiditis   PROCEDURES  Procedure(s) performed: no  Procedures  Critical Care performed: no  Observation: no ____________________________________________   INITIAL IMPRESSION / ASSESSMENT AND PLAN / ED COURSE  Pertinent labs & imaging results that were available during my care of the patient  were reviewed by me and considered in my medical decision making (see chart for details).  The patient is hemodynamically stable and quite well-appearing.  She does have serous fluid behind her right ear which could represent an early otitis media.  Given viscous lidocaine in her right ear with improvement in her symptoms.  I will treat her with 7 days of amoxicillin and refer her back to primary care.  The patient verbalizes understanding and agreement with the plan.      ____________________________________________   FINAL CLINICAL IMPRESSION(S) / ED DIAGNOSES  Final diagnoses:  Non-recurrent acute serous otitis media of right ear      NEW MEDICATIONS STARTED DURING THIS VISIT:  Discharge Medication List as of 09/22/2017  6:27 AM    START taking these medications   Details  amoxicillin (AMOXIL) 500 MG tablet Take 2 tablets (1,000 mg total) by mouth 2 (two) times daily for 7 days., Starting Thu 09/22/2017, Until Thu 09/29/2017, Print         Note:  This document was prepared using Dragon voice recognition software and may include unintentional dictation errors.      Merrily Brittle, MD 09/22/17 2240

## 2017-09-22 NOTE — Discharge Instructions (Signed)
It was a pleasure to take care of you today, and thank you for coming to our emergency department.  If you have any questions or concerns before leaving please ask the nurse to grab me and I'm more than happy to go through your aftercare instructions again. ° °If you were prescribed any opioid pain medication today such as Norco, Vicodin, Percocet, morphine, hydrocodone, or oxycodone please make sure you do not drive when you are taking this medication as it can alter your ability to drive safely. ° °If you have any concerns once you are home that you are not improving or are in fact getting worse before you can make it to your follow-up appointment, please do not hesitate to call 911 and come back for further evaluation. ° °Earsie Humm, MD ° ° ° °

## 2017-11-02 DIAGNOSIS — M1990 Unspecified osteoarthritis, unspecified site: Secondary | ICD-10-CM | POA: Insufficient documentation

## 2017-12-23 ENCOUNTER — Emergency Department: Payer: Medicare Other

## 2017-12-23 ENCOUNTER — Encounter: Payer: Self-pay | Admitting: Emergency Medicine

## 2017-12-23 ENCOUNTER — Other Ambulatory Visit: Payer: Self-pay

## 2017-12-23 ENCOUNTER — Emergency Department
Admission: EM | Admit: 2017-12-23 | Discharge: 2017-12-23 | Disposition: A | Discharge status: Home / Self Care | Payer: Medicare Other | Attending: Emergency Medicine | Admitting: Emergency Medicine

## 2017-12-23 DIAGNOSIS — F17200 Nicotine dependence, unspecified, uncomplicated: Secondary | ICD-10-CM | POA: Insufficient documentation

## 2017-12-23 DIAGNOSIS — M545 Low back pain, unspecified: Secondary | ICD-10-CM

## 2017-12-23 DIAGNOSIS — R300 Dysuria: Secondary | ICD-10-CM | POA: Insufficient documentation

## 2017-12-23 DIAGNOSIS — M47816 Spondylosis without myelopathy or radiculopathy, lumbar region: Secondary | ICD-10-CM

## 2017-12-23 LAB — CBC
HCT: 35.6 % (ref 35.0–47.0)
Hemoglobin: 12.5 g/dL (ref 12.0–16.0)
MCH: 31.9 pg (ref 26.0–34.0)
MCHC: 35.1 g/dL (ref 32.0–36.0)
MCV: 90.8 fL (ref 80.0–100.0)
PLATELETS: 264 10*3/uL (ref 150–440)
RBC: 3.92 MIL/uL (ref 3.80–5.20)
RDW: 14.3 % (ref 11.5–14.5)
WBC: 8.1 10*3/uL (ref 3.6–11.0)

## 2017-12-23 LAB — COMPREHENSIVE METABOLIC PANEL
ALT: 8 U/L (ref 0–44)
ANION GAP: 10 (ref 5–15)
AST: 15 U/L (ref 15–41)
Albumin: 4.3 g/dL (ref 3.5–5.0)
Alkaline Phosphatase: 64 U/L (ref 38–126)
BUN: 16 mg/dL (ref 8–23)
CHLORIDE: 101 mmol/L (ref 98–111)
CO2: 24 mmol/L (ref 22–32)
CREATININE: 0.71 mg/dL (ref 0.44–1.00)
Calcium: 9.4 mg/dL (ref 8.9–10.3)
GFR calc Af Amer: >60 mL/min (ref >60)
GFR calc non Af Amer: >60 mL/min (ref >60)
Glucose, Bld: 116 mg/dL — ABNORMAL HIGH (ref 70–99)
Potassium: 3.8 mmol/L (ref 3.5–5.1)
SODIUM: 135 mmol/L (ref 135–145)
Total Bilirubin: 0.6 mg/dL (ref 0.3–1.2)
Total Protein: 6.6 g/dL (ref 6.5–8.1)

## 2017-12-23 LAB — URINALYSIS, COMPLETE (UACMP) WITH MICROSCOPIC
BACTERIA UA: NONE SEEN
BILIRUBIN URINE: NEGATIVE
Glucose, UA: NEGATIVE mg/dL
KETONES UR: NEGATIVE mg/dL
LEUKOCYTES UA: NEGATIVE
NITRITE: NEGATIVE
PH: 6 (ref 5.0–8.0)
PROTEIN: NEGATIVE mg/dL
Specific Gravity, Urine: 1.001 — ABNORMAL LOW (ref 1.005–1.030)

## 2017-12-23 LAB — LIPASE, BLOOD: Lipase: 29 U/L (ref 11–51)

## 2017-12-23 NOTE — ED Notes (Signed)
Pt c/o left and right flank pain that started today with 9/10 pressure and now pt reporting frequency and lower abdominal pressure  Reports no hx of UTI and denies kidney stones or other health problems  After urination att pt reports burning

## 2017-12-23 NOTE — ED Triage Notes (Signed)
Pt comes into the ED via POV c/o lower back pain and painful urination.  Patient states this started last night and then it progressed to today with more discomfort.  Patient ambulatory to triage at this time and has no other complaints.  Patient states she has terrible burning after urination.

## 2017-12-23 NOTE — ED Notes (Signed)
Pt reports abdominal pain att, reports hx of "bowel-line pain"

## 2017-12-23 NOTE — Discharge Instructions (Addendum)
Please take Tylenol 500 mg to 1000 mg every 6 hours as needed for your back pain.  Apply heating pad to the lower back.  Please call your primary care doctor Monday morning to schedule follow-up appointment to review your x-rays, labs and urinalysis.  Return to the emergency department for any increasing pain, fevers, worsening symptoms or urgent changes in your health.

## 2017-12-23 NOTE — ED Provider Notes (Signed)
[facility] EMERGENCY DEPARTMENT Provider Note   CSN: [id number] Arrival date & time: 12/23/17  1848     History   Chief Complaint Chief Complaint  Patient presents with  . Back Pain  . Dysuria    HPI [name] is a 77 y.o. female presents to the emergency department for evaluation of acute lower back pain and dysuria.  Symptoms began today.  She is a difficult historian.  Patient states this morning while sitting she developed aching pain in the lower back that is worse with movement.  She denies any trauma or injury.  Her pain is located along the left than right lower portion of the lumbar spine with no radicular symptoms.  She denies any lower leg discomfort.  She denies any abdominal pain but states she feels pressure in her lower abdomen with urination.  She denies any dysuria or increase in urinary frequency.  She is had normal urinary output with no difficulties voiding.  No blood in her urine.  No vaginal discharge or vaginal bleeding.  She has no history of kidney stones, nausea, vomiting, fevers.  She had a normal bowel movement today.  Patient has been eating and drinking well.  She has not taking any medications for her lower back pain or abdominal pressure, she states her pain is mild with sitting but with standing 5 out of 10.   HPI  Past Medical History:  Diagnosis Date  . Thyroid disease     There are no active problems to display for this patient.   Past Surgical History:  Procedure Laterality Date  . ABDOMINAL HYSTERECTOMY       OB History   None      Home Medications    Prior to Admission medications   Medication Sig Start Date End Date Taking? Authorizing Provider  azithromycin (ZITHROMAX) 250 MG tablet Take 1 tablet (250 mg total) by mouth daily. 08/14/17   Irean Hong, MD  phenazopyridine (PYRIDIUM) 95 MG tablet Take 1 tablet (95 mg total) by mouth 3 (three) times daily as needed for pain. 10/26/16   Nita Sickle,  MD    Family History No family history on file.  Social History Social History   Tobacco Use  . Smoking status: Current Every Day Smoker  . Smokeless tobacco: Never Used  Substance Use Topics  . Alcohol use: Yes  . Drug use: No     Allergies   Patient has no known allergies.   Review of Systems Review of Systems  Constitutional: Negative for activity change, chills, fatigue and fever.  HENT: Negative for congestion, sinus pressure and sore throat.   Eyes: Negative for visual disturbance.  Respiratory: Negative for cough, chest tightness and shortness of breath.   Cardiovascular: Negative for chest pain and leg swelling.  Gastrointestinal: Negative for abdominal pain, diarrhea, nausea and vomiting.  Genitourinary: Positive for pelvic pain (pressue). Negative for difficulty urinating, dysuria, frequency, urgency, vaginal bleeding and vaginal discharge.  Musculoskeletal: Positive for back pain. Negative for arthralgias and gait problem.  Skin: Negative for rash and wound.  Neurological: Negative for weakness, numbness and headaches.  Hematological: Negative for adenopathy.  Psychiatric/Behavioral: Negative for agitation, behavioral problems and confusion.     Physical Exam Updated Vital Signs BP (!) 145/84 (BP Location: Left Arm)   Pulse 78   Temp 98.3 F (36.8 C) (Oral)   Resp 16   Ht 5\' 4"  (1.626 m)   Wt 49.9 kg   SpO2 99%  BMI 18.88 kg/m   Physical Exam  Constitutional: She is oriented to person, place, and time. She appears well-developed and well-nourished. No distress.  HENT:  Head: Normocephalic and atraumatic.  Mouth/Throat: Oropharynx is clear and moist.  Eyes: Pupils are equal, round, and reactive to light. EOM are normal. Right eye exhibits no discharge. Left eye exhibits no discharge.  Neck: Normal range of motion. Neck supple.  Cardiovascular: Normal rate, regular rhythm and intact distal pulses.  Pulmonary/Chest: Effort normal and breath sounds  normal. No respiratory distress. She exhibits no tenderness.  Abdominal: Soft. Bowel sounds are normal. She exhibits no distension. There is no tenderness.  Musculoskeletal: Normal range of motion. She exhibits no edema.  Mild tenderness to the lower back along the spinous process at the lumbosacral junction with mild left and right paravertebral muscle tenderness.  She has good range of motion lumbar spine with only mild discomfort with lumbar flexion.  She is nontender along the SI joints, sacrum, pelvis.  She is full range of motion both hips with no discomfort.  She is nontender along the thoracic spine and no CVA tenderness bilaterally.  She is ambulatory with no antalgic gait.  Neurological: She is alert and oriented to person, place, and time. She has normal reflexes.  Skin: Skin is warm and dry. No rash noted.  Psychiatric: She has a normal mood and affect. Her behavior is normal. Thought content normal.     ED Treatments / Results  Labs (all labs ordered are listed, but only abnormal results are displayed) Labs Reviewed  URINALYSIS, COMPLETE (UACMP) WITH MICROSCOPIC - Abnormal; Notable for the following components:      Result Value   Color, Urine COLORLESS (*)    APPearance CLEAR (*)    Specific Gravity, Urine 1.001 (*)    Hgb urine dipstick SMALL (*)    All other components within normal limits  COMPREHENSIVE METABOLIC PANEL - Abnormal; Notable for the following components:   Glucose, Bld 116 (*)    All other components within normal limits  URINE CULTURE  CBC  LIPASE, BLOOD    EKG None  Radiology Dg Lumbar Spine Complete  Result Date: 12/23/2017 CLINICAL DATA:  Left and right flank pain starting today. EXAM: LUMBAR SPINE - COMPLETE 4+ VIEW COMPARISON:  None. FINDINGS: Five non ribbed lumbar vertebrae in maintained lumbar lordosis. Facet arthropathy with sclerosis and joint space narrowing is identified from L3 through S1. Minimal grade 1 anterolisthesis of L4 on L5. The  bones are demineralized without acute fracture. Degenerative disc disease with moderate disc flattening is identified at L4-5. The included SI joints are congruent. IMPRESSION: 1. No acute fracture. 2. Grade 1 anterolisthesis of L4 on L5 with associated moderate disc flattening. 3. Degenerative facet arthropathy L3 through S1.  No pars defects. Electronically Signed   By: Tollie Eth M.D.   On: 12/23/2017 20:22    Procedures Procedures (including critical care time)  Medications Ordered in ED Medications - No data to display   Initial Impression / Assessment and Plan / ED Course  I have reviewed the triage vital signs and the nursing notes.  Pertinent labs & imaging results that were available during my care of the patient were reviewed by me and considered in my medical decision making (see chart for details).     77 year old female with vague complaints of lower back pain and pressure in her abdomen with urination.  She reported to nursing she had discomfort with burning with urination but denied  any of this with me.  She is able to make urine well with no signs of retention.  Urinalysis was obtained and normal showing no signs of infection.  Small amount of hemoglobin noted in the urine.  CBC and CMP all within normal limits, no elevated white count.  X-rays of the lumbar spine show no evidence of compression fracture.  She was noted to have grade 1 anterior listhesis of L4 on L5 with moderate disc degeneration and lumbar spondylosis.  Patient's vital signs are normal, she is afebrile.  Patient will call PCP Monday to schedule follow-up appointment.  She understands signs symptoms return to ED for.  Final Clinical Impressions(s) / ED Diagnoses   Final diagnoses:  Lumbar spondylosis  Acute midline low back pain without sciatica    ED Discharge Orders    None       Ronnette Juniper 12/23/17 2143    Sharyn Creamer, MD 12/24/17 [phone]

## 2017-12-23 NOTE — ED Notes (Signed)
Peripheral IV discontinued. Catheter intact. No signs of infiltration or redness. Gauze applied to IV site.    Discharge instructions reviewed with patient. Questions fielded by this RN. Patient verbalizes understanding of instructions. Patient discharged home in stable condition per gaines. No acute distress noted at time of discharge.     

## 2017-12-25 LAB — URINE CULTURE: CULTURE: NO GROWTH

## 2018-08-05 ENCOUNTER — Encounter: Payer: Self-pay | Admitting: Emergency Medicine

## 2018-08-05 ENCOUNTER — Emergency Department
Admission: EM | Admit: 2018-08-05 | Discharge: 2018-08-05 | Disposition: A | Discharge status: Home / Self Care | Payer: Medicare Other | Attending: Emergency Medicine | Admitting: Emergency Medicine

## 2018-08-05 ENCOUNTER — Other Ambulatory Visit: Payer: Self-pay

## 2018-08-05 DIAGNOSIS — Z5321 Procedure and treatment not carried out due to patient leaving prior to being seen by health care provider: Secondary | ICD-10-CM | POA: Diagnosis not present

## 2018-08-05 DIAGNOSIS — N390 Urinary tract infection, site not specified: Secondary | ICD-10-CM | POA: Diagnosis present

## 2018-08-05 LAB — URINALYSIS, COMPLETE (UACMP) WITH MICROSCOPIC
Bacteria, UA: NONE SEEN
Bilirubin Urine: NEGATIVE
Glucose, UA: NEGATIVE mg/dL
Ketones, ur: NEGATIVE mg/dL
Leukocytes,Ua: NEGATIVE
Nitrite: NEGATIVE
Protein, ur: NEGATIVE mg/dL
Specific Gravity, Urine: 1.002 — ABNORMAL LOW (ref 1.005–1.030)
pH: 6 (ref 5.0–8.0)

## 2018-08-05 NOTE — ED Triage Notes (Signed)
Patient states that she was seen at urgent care on Sunday and diagnosed with a UTI. Patient states that she finishes the antibiotics today and that she still has burning with urination.

## 2018-08-07 ENCOUNTER — Other Ambulatory Visit: Payer: Self-pay

## 2018-08-07 ENCOUNTER — Encounter: Payer: Self-pay | Admitting: Emergency Medicine

## 2018-08-07 ENCOUNTER — Telehealth: Payer: Self-pay | Admitting: Emergency Medicine

## 2018-08-07 DIAGNOSIS — N390 Urinary tract infection, site not specified: Secondary | ICD-10-CM | POA: Insufficient documentation

## 2018-08-07 DIAGNOSIS — R1030 Lower abdominal pain, unspecified: Secondary | ICD-10-CM | POA: Diagnosis present

## 2018-08-07 DIAGNOSIS — F17200 Nicotine dependence, unspecified, uncomplicated: Secondary | ICD-10-CM | POA: Diagnosis not present

## 2018-08-07 LAB — URINALYSIS, COMPLETE (UACMP) WITH MICROSCOPIC
Bacteria, UA: NONE SEEN
Bilirubin Urine: NEGATIVE
Glucose, UA: NEGATIVE mg/dL
Hgb urine dipstick: NEGATIVE
Ketones, ur: NEGATIVE mg/dL
Leukocytes,Ua: NEGATIVE
Nitrite: POSITIVE — AB
Protein, ur: NEGATIVE mg/dL
Specific Gravity, Urine: 1.001 — ABNORMAL LOW (ref 1.005–1.030)
Squamous Epithelial / LPF: NONE SEEN (ref 0–5)
pH: 6 (ref 5.0–8.0)

## 2018-08-07 LAB — COMPREHENSIVE METABOLIC PANEL
ALT: 11 U/L (ref 0–44)
AST: 17 U/L (ref 15–41)
Albumin: 4.7 g/dL (ref 3.5–5.0)
Alkaline Phosphatase: 69 U/L (ref 38–126)
Anion gap: 12 (ref 5–15)
BUN: 11 mg/dL (ref 8–23)
CO2: 22 mmol/L (ref 22–32)
Calcium: 9.2 mg/dL (ref 8.9–10.3)
Chloride: 102 mmol/L (ref 98–111)
Creatinine, Ser: 1.02 mg/dL — ABNORMAL HIGH (ref 0.44–1.00)
GFR calc Af Amer: >60 mL/min (ref >60)
GFR calc non Af Amer: 52 mL/min — ABNORMAL LOW (ref >60)
Glucose, Bld: 106 mg/dL — ABNORMAL HIGH (ref 70–99)
Potassium: 3.6 mmol/L (ref 3.5–5.1)
Sodium: 136 mmol/L (ref 135–145)
Total Bilirubin: 0.5 mg/dL (ref 0.3–1.2)
Total Protein: 6.9 g/dL (ref 6.5–8.1)

## 2018-08-07 LAB — CBC
HCT: 40.8 % (ref 36.0–46.0)
Hemoglobin: 13.8 g/dL (ref 12.0–15.0)
MCH: 30.6 pg (ref 26.0–34.0)
MCHC: 33.8 g/dL (ref 30.0–36.0)
MCV: 90.5 fL (ref 80.0–100.0)
Platelets: 272 10*3/uL (ref 150–400)
RBC: 4.51 MIL/uL (ref 3.87–5.11)
RDW: 13.3 % (ref 11.5–15.5)
WBC: 7.1 10*3/uL (ref 4.0–10.5)
nRBC: 0 % (ref 0.0–0.2)

## 2018-08-07 NOTE — Telephone Encounter (Signed)
Called patient due to lwot to inquire about condition and follow up plans. Left message.   

## 2018-08-07 NOTE — ED Triage Notes (Signed)
Pt presents to ED with burning with urination and lower abd pain. Dx last Sunday with uti and finished antibiotic Saturday and was having continued painful urination. Pt states she went back to urgent care this past Sunday and got additional antibiotics but is still not feeling any better.

## 2018-08-08 ENCOUNTER — Emergency Department
Admission: EM | Admit: 2018-08-08 | Discharge: 2018-08-08 | Disposition: A | Discharge status: Home / Self Care | Payer: Medicare Other | Attending: Emergency Medicine | Admitting: Emergency Medicine

## 2018-08-08 ENCOUNTER — Encounter: Payer: Self-pay | Admitting: Radiology

## 2018-08-08 ENCOUNTER — Emergency Department: Payer: Medicare Other

## 2018-08-08 DIAGNOSIS — N390 Urinary tract infection, site not specified: Secondary | ICD-10-CM

## 2018-08-08 DIAGNOSIS — R109 Unspecified abdominal pain: Secondary | ICD-10-CM

## 2018-08-08 MED ORDER — LORAZEPAM 2 MG/ML IJ SOLN
1.0000 mg | Freq: Once | INTRAMUSCULAR | Status: AC
  Administered 2018-08-08: 03:00:00 1 mg via INTRAVENOUS
  Filled 2018-08-08: qty 1

## 2018-08-08 MED ORDER — IOHEXOL 300 MG/ML  SOLN
75.0000 mL | Freq: Once | INTRAMUSCULAR | Status: AC | PRN
  Administered 2018-08-08: 75 mL via INTRAVENOUS

## 2018-08-08 NOTE — ED Notes (Signed)
Called son that mom is discharged and he will drive down to [place]. He is aware that she had medication that has made her very tired and he needs to help her get in to house and to bed

## 2018-08-08 NOTE — Discharge Instructions (Addendum)
Please seek medical attention for any high fevers, chest pain, shortness of breath, change in behavior, persistent vomiting, bloody stool or any other new or concerning symptoms.  

## 2018-08-08 NOTE — ED Provider Notes (Signed)
[facility] Emergency Department Provider Note    ____________________________________________   I have reviewed the triage vital signs and the nursing notes.   HISTORY  Chief Complaint Abdominal Pain   History limited by: Not Limited   HPI [name] is a 78 y.o. female who presents to the emergency department today because of concern for continued lower abdominal pain. She describes it as burning. It has been present for the past three days. Went to urgent care and was prescribed antibiotics and pyridium. Has been taking bother for over 24 hours with continued symptoms. The patient states that the burning is now moving up her stomach. She denies any fevers. No nausea or vomiting.   Records reviewed. Per medical record review patient has a history of thyroid disease.  Past Medical History:  Diagnosis Date  . Thyroid disease     There are no active problems to display for this patient.   Past Surgical History:  Procedure Laterality Date  . ABDOMINAL HYSTERECTOMY    . FOOT SURGERY      Prior to Admission medications   Medication Sig Start Date End Date Taking? Authorizing Provider  azithromycin (ZITHROMAX) 250 MG tablet Take 1 tablet (250 mg total) by mouth daily. 08/14/17   Irean HongSung, Jade J, MD  phenazopyridine (PYRIDIUM) 95 MG tablet Take 1 tablet (95 mg total) by mouth 3 (three) times daily as needed for pain. 10/26/16   Nita SickleVeronese, [place], MD    Allergies Patient has no known allergies.  No family history on file.  Social History Social History   Tobacco Use  . Smoking status: Current Every Day Smoker  . Smokeless tobacco: Never Used  Substance Use Topics  . Alcohol use: Not Currently  . Drug use: No    Review of Systems Constitutional: No fever/chills Eyes: No visual changes. ENT: No sore throat. Cardiovascular: Denies chest pain. Respiratory: Denies shortness of breath. Gastrointestinal: Positive for abdominal pain.   Genitourinary: Negative for dysuria. Musculoskeletal: Negative for back pain. Skin: Negative for rash. Neurological: Negative for headaches, focal weakness or numbness.  ____________________________________________   PHYSICAL EXAM:  VITAL SIGNS: ED Triage Vitals  Enc Vitals Group     BP 08/07/18 2134 (!) 142/67     Pulse Rate 08/07/18 2134 77     Resp 08/07/18 2134 18     Temp 08/07/18 2134 97.9 F (36.6 C)     Temp Source 08/07/18 2134 Oral     SpO2 08/07/18 2134 99 %     Weight 08/07/18 2135 105 lb (47.6 kg)     Height 08/07/18 2135 5\' 4"  (1.626 m)   Constitutional: Alert and oriented.  Eyes: Conjunctivae are normal.  ENT      Head: Normocephalic and atraumatic.      Nose: No congestion/rhinnorhea.      Mouth/Throat: Mucous membranes are moist.      Neck: No stridor. Hematological/Lymphatic/Immunilogical: No cervical lymphadenopathy. Cardiovascular: Normal rate, regular rhythm.  No murmurs, rubs, or gallops.  Respiratory: Normal respiratory effort without tachypnea nor retractions. Breath sounds are clear and equal bilaterally. No wheezes/rales/rhonchi. Gastrointestinal: Soft and tender to palpation in the lower abdomen. No rebound. No guarding.  Genitourinary: Deferred Musculoskeletal: Normal range of motion in all extremities. No lower extremity edema. Neurologic:  Normal speech and language. No gross focal neurologic deficits are appreciated.  Skin:  Skin is warm, dry and intact. No rash noted. Psychiatric: Mood and affect are normal. Speech and behavior are normal. Patient exhibits appropriate insight  and judgment.  ____________________________________________    LABS (pertinent positives/negatives)  UA positive nitrite, 0-5 wbc CMP wnl except glu 106, cr 1.02 CBC wbc 7.1, hgb 13.8, plt 272  ____________________________________________   EKG  None  ____________________________________________    RADIOLOGY  CT abd/pel Possible  enteritis  ____________________________________________   PROCEDURES  Procedures  ____________________________________________   INITIAL IMPRESSION / ASSESSMENT AND PLAN / ED COURSE  Pertinent labs & imaging results that were available during my care of the patient were reviewed by me and considered in my medical decision making (see chart for details).   Patient presented to the emergency department today with continued lower abdominal burning. Has had treatment for UTI for slightly over 1 hour. CT scan was obtained given she did have some tenderness to lower abdomen. This only showed some possible enteritis. Patient denies any change in bowel. At this time think likely artifactual finding on the ct scan. Do think this could still be related to UTI. Will send urine culture.   ____________________________________________   FINAL CLINICAL IMPRESSION(S) / ED DIAGNOSES  Final diagnoses:  Lower urinary tract infectious disease  Abdominal pain, unspecified abdominal location     Note: This dictation was prepared with Dragon dictation. Any transcriptional errors that result from this process are unintentional     Phineas Semen, MD 08/08/18 [phone]

## 2018-08-09 LAB — URINE CULTURE: Culture: NO GROWTH

## 2018-08-21 ENCOUNTER — Other Ambulatory Visit: Payer: Self-pay

## 2018-08-21 ENCOUNTER — Emergency Department
Admission: EM | Admit: 2018-08-21 | Discharge: 2018-08-22 | Disposition: A | Discharge status: Home / Self Care | Payer: Medicare Other | Attending: Emergency Medicine | Admitting: Emergency Medicine

## 2018-08-21 ENCOUNTER — Encounter: Payer: Self-pay | Admitting: Emergency Medicine

## 2018-08-21 ENCOUNTER — Emergency Department: Payer: Medicare Other

## 2018-08-21 DIAGNOSIS — R6884 Jaw pain: Secondary | ICD-10-CM | POA: Diagnosis present

## 2018-08-21 DIAGNOSIS — M542 Cervicalgia: Secondary | ICD-10-CM | POA: Diagnosis not present

## 2018-08-21 DIAGNOSIS — F17210 Nicotine dependence, cigarettes, uncomplicated: Secondary | ICD-10-CM | POA: Insufficient documentation

## 2018-08-21 NOTE — ED Triage Notes (Signed)
Pt c/o bilateral ear pain x1 day. Pt has ENT appointment next month but unable to get relief before so. Pt denies drainage as well as fever.

## 2018-08-21 NOTE — ED Provider Notes (Signed)
[facility] Emergency Department Provider Note  ____________________________________________  Time seen: Approximately 11:32 PM  I have reviewed the triage vital signs and the nursing notes.   HISTORY  Chief Complaint Otalgia    HPI [name] is a 78 y.o. female presents to the emergency department with left-sided jaw pain and neck pain that is "tender to the touch" that has been present for the past 2 days.  Patient states that it has been hard for her to swallow and is observed using throat lozenges in the emergency department.  Patient states that she has not been eating as much due to pain.  Patient states that she  had an ear infection several years ago and she thinks that might be happening again.  Patient seems very disoriented and is telling me that she is trying to find a doctor who bruised her hip several weeks ago.  She denies fever, chills, cough, chest tightness or chest pain.  Patient states that she has been alone in her home with her son and daughter in law since the pandemic started and states that she thinks this is why she feels so bad. No alleviating measures have been attempted.         Past Medical History:  Diagnosis Date  . Thyroid disease     There are no active problems to display for this patient.   Past Surgical History:  Procedure Laterality Date  . ABDOMINAL HYSTERECTOMY    . FOOT SURGERY      Prior to Admission medications   Medication Sig Start Date End Date Taking? Authorizing Provider  levothyroxine (SYNTHROID) 100 MCG tablet Take 100 mcg by mouth daily before breakfast.   Yes [provider]  azithromycin (ZITHROMAX) 250 MG tablet Take 1 tablet (250 mg total) by mouth daily. 08/14/17   Irean HongSung, Jade J, MD  phenazopyridine (PYRIDIUM) 95 MG tablet Take 1 tablet (95 mg total) by mouth 3 (three) times daily as needed for pain. 10/26/16   Nita SickleVeronese, Holiday, MD    Allergies Patient has no known  allergies.  History reviewed. No pertinent family history.  Social History Social History   Tobacco Use  . Smoking status: Current Every Day Smoker  . Smokeless tobacco: Never Used  Substance Use Topics  . Alcohol use: Not Currently  . Drug use: No     Review of Systems  Constitutional: No fever/chills Eyes: No visual changes. No discharge ENT: Patient has neck pain and left sided jaw pain.  Cardiovascular: no chest pain. Respiratory: no cough. No SOB. Gastrointestinal: No abdominal pain.  No nausea, no vomiting.  No diarrhea.  No constipation. Genitourinary: Negative for dysuria. No hematuria. Musculoskeletal: Negative for musculoskeletal pain. Skin: Negative for rash, abrasions, lacerations, ecchymosis. Neurological: Negative for headaches, focal weakness or numbness.   ____________________________________________   PHYSICAL EXAM:  VITAL SIGNS: ED Triage Vitals [08/21/18 2245]  Enc Vitals Group     BP (!) 162/64     Pulse Rate 77     Resp 19     Temp 98 F (36.7 C)     Temp Source Oral     SpO2 98 %     Weight      Height      Head Circumference      Peak Flow      Pain Score      Pain Loc      Pain Edu?      Excl. in GC?  Constitutional: Alert and oriented. Well appearing and in no acute distress. Eyes: Conjunctivae are normal. PERRL. EOMI. Head: Atraumatic. ENT:      Ears: Patient has cloudy fluid visualized in left middle ear but no erythema.       Nose: No congestion/rhinnorhea.      Mouth/Throat: Mucous membranes are moist.  Neck: No stridor.  Patient has tenderness to palpation over left submandibular neck. Hematological/Lymphatic/Immunilogical: No cervical lymphadenopathy. Cardiovascular: Normal rate, regular rhythm. Normal S1 and S2.  Good peripheral circulation. Respiratory: Normal respiratory effort without tachypnea or retractions. Lungs CTAB. Good air entry to the bases with no decreased or absent breath sounds. Gastrointestinal:  Bowel sounds 4 quadrants. Soft and nontender to palpation. No guarding or rigidity. No palpable masses. No distention. No CVA tenderness. Musculoskeletal: Full range of motion to all extremities. No gross deformities appreciated. Neurologic:  Normal speech and language. No gross focal neurologic deficits are appreciated.  Skin:  Skin is warm, dry and intact. No rash noted. Psychiatric: Mood and affect are normal. Speech and behavior are normal. Patient exhibits appropriate insight and judgement.   ____________________________________________   LABS (all labs ordered are listed, but only abnormal results are displayed)  Labs Reviewed  CBC WITH DIFFERENTIAL/PLATELET  COMPREHENSIVE METABOLIC PANEL  URINALYSIS, COMPLETE (UACMP) WITH MICROSCOPIC  TROPONIN I  LACTIC ACID, PLASMA  LACTIC ACID, PLASMA   ____________________________________________  EKG   ____________________________________________  RADIOLOGY     No results found.  ____________________________________________    PROCEDURES  Procedure(s) performed:    Procedures    Medications - No data to display   ____________________________________________   INITIAL IMPRESSION / ASSESSMENT AND PLAN / ED COURSE  Pertinent labs & imaging results that were available during my care of the patient were reviewed by me and considered in my medical decision making (see chart for details).  Review of the [place] CSRS was performed in accordance of the [facility] prior to dispensing any controlled drugs.           Assessment and Plan:  Jaw pain Neck pain 78 year old female presents to the emergency department with left-sided jaw pain and neck pain for the past 2 days.  Patient states that it is been hard to eat/swallow due to pain and discomfort.  On physical exam, patient's history changes several times.  She seems disoriented and confused.  She has some cloudy fluid visualized behind left TM but no signs of otitis media.   She has reproducible tenderness to palpation along left submandibular neck.  Differential diagnosis included STEMI, sialoadenitis, parotitis, deep neck abscess, trigeminal neuralgia and dental abscess...  Work-up is pending at this time. Patient care was turned over to attending, Dr. Derrill Kay.    ____________________________________________  FINAL CLINICAL IMPRESSION(S) / ED DIAGNOSES  Final diagnoses:  Neck pain      NEW MEDICATIONS STARTED DURING THIS VISIT:  ED Discharge Orders    None          This chart was dictated using voice recognition software/Dragon. Despite best efforts to proofread, errors can occur which can change the meaning. Any change was purely unintentional.    Orvil Feil, PA-C 08/21/18 2359    Minna Antis, MD 08/22/18 2241

## 2018-08-22 ENCOUNTER — Emergency Department: Payer: Medicare Other

## 2018-08-22 ENCOUNTER — Encounter: Payer: Self-pay | Admitting: Radiology

## 2018-08-22 DIAGNOSIS — M542 Cervicalgia: Secondary | ICD-10-CM | POA: Diagnosis not present

## 2018-08-22 LAB — CBC WITH DIFFERENTIAL/PLATELET
Abs Immature Granulocytes: 0.03 10*3/uL (ref 0.00–0.07)
Basophils Absolute: 0 10*3/uL (ref 0.0–0.1)
Basophils Relative: 1 %
Eosinophils Absolute: 0.2 10*3/uL (ref 0.0–0.5)
Eosinophils Relative: 3 %
HCT: 35 % — ABNORMAL LOW (ref 36.0–46.0)
Hemoglobin: 11.5 g/dL — ABNORMAL LOW (ref 12.0–15.0)
Immature Granulocytes: 1 %
Lymphocytes Relative: 18 %
Lymphs Abs: 1.1 10*3/uL (ref 0.7–4.0)
MCH: 30.2 pg (ref 26.0–34.0)
MCHC: 32.9 g/dL (ref 30.0–36.0)
MCV: 91.9 fL (ref 80.0–100.0)
Monocytes Absolute: 0.5 10*3/uL (ref 0.1–1.0)
Monocytes Relative: 9 %
Neutro Abs: 4.1 10*3/uL (ref 1.7–7.7)
Neutrophils Relative %: 68 %
Platelets: 277 10*3/uL (ref 150–400)
RBC: 3.81 MIL/uL — ABNORMAL LOW (ref 3.87–5.11)
RDW: 13.2 % (ref 11.5–15.5)
WBC: 5.9 10*3/uL (ref 4.0–10.5)
nRBC: 0 % (ref 0.0–0.2)

## 2018-08-22 LAB — COMPREHENSIVE METABOLIC PANEL
ALT: 10 U/L (ref 0–44)
AST: 13 U/L — ABNORMAL LOW (ref 15–41)
Albumin: 3.9 g/dL (ref 3.5–5.0)
Alkaline Phosphatase: 70 U/L (ref 38–126)
Anion gap: 10 (ref 5–15)
BUN: 10 mg/dL (ref 8–23)
CO2: 24 mmol/L (ref 22–32)
Calcium: 9 mg/dL (ref 8.9–10.3)
Chloride: 106 mmol/L (ref 98–111)
Creatinine, Ser: 0.8 mg/dL (ref 0.44–1.00)
GFR calc Af Amer: >60 mL/min (ref >60)
GFR calc non Af Amer: >60 mL/min (ref >60)
Glucose, Bld: 123 mg/dL — ABNORMAL HIGH (ref 70–99)
Potassium: 4 mmol/L (ref 3.5–5.1)
Sodium: 140 mmol/L (ref 135–145)
Total Bilirubin: 0.6 mg/dL (ref 0.3–1.2)
Total Protein: 6.4 g/dL — ABNORMAL LOW (ref 6.5–8.1)

## 2018-08-22 LAB — TROPONIN I: Troponin I: <0.03 ng/mL (ref <0.03)

## 2018-08-22 LAB — LACTIC ACID, PLASMA: Lactic Acid, Venous: 0.9 mmol/L (ref 0.5–1.9)

## 2018-08-22 MED ORDER — IOHEXOL 300 MG/ML  SOLN
75.0000 mL | Freq: Once | INTRAMUSCULAR | Status: AC | PRN
  Administered 2018-08-22: 75 mL via INTRAVENOUS

## 2018-08-22 MED ORDER — IOPAMIDOL (ISOVUE-300) INJECTION 61%: 75.0000 mL | Freq: Once | INTRAVENOUS | Status: DC | PRN

## 2018-08-22 MED ORDER — LORAZEPAM 2 MG/ML IJ SOLN
INTRAMUSCULAR | Status: AC
  Filled 2018-08-22: qty 1

## 2018-08-22 MED ORDER — LORAZEPAM 2 MG/ML IJ SOLN
1.0000 mg | Freq: Once | INTRAMUSCULAR | Status: AC
  Administered 2018-08-22: 1 mg via INTRAVENOUS

## 2018-08-22 NOTE — ED Notes (Signed)
Patient transported to CT 

## 2018-08-22 NOTE — ED Provider Notes (Signed)
Patient work up without any obvious etiology of the patient's symptoms or concerning findings. When I went to discuss with the patient she stated that she felt ready to go home. States she has an appointment scheduled next month with ENT.    Phineas Semen, MD 08/22/18 [phone]

## 2018-08-22 NOTE — Discharge Instructions (Addendum)
Please seek medical attention for any high fevers, chest pain, shortness of breath, change in behavior, persistent vomiting, bloody stool or any other new or concerning symptoms.  

## 2018-09-01 ENCOUNTER — Encounter: Payer: Self-pay | Admitting: *Deleted

## 2018-09-01 ENCOUNTER — Emergency Department
Admission: EM | Admit: 2018-09-01 | Discharge: 2018-09-01 | Disposition: A | Discharge status: Home / Self Care | Payer: Medicare Other | Attending: Emergency Medicine | Admitting: Emergency Medicine

## 2018-09-01 ENCOUNTER — Other Ambulatory Visit: Payer: Self-pay

## 2018-09-01 DIAGNOSIS — F17200 Nicotine dependence, unspecified, uncomplicated: Secondary | ICD-10-CM | POA: Diagnosis not present

## 2018-09-01 DIAGNOSIS — Z79899 Other long term (current) drug therapy: Secondary | ICD-10-CM | POA: Insufficient documentation

## 2018-09-01 DIAGNOSIS — R21 Rash and other nonspecific skin eruption: Secondary | ICD-10-CM | POA: Insufficient documentation

## 2018-09-01 LAB — CBC WITH DIFFERENTIAL/PLATELET
Abs Immature Granulocytes: 0.02 10*3/uL (ref 0.00–0.07)
Basophils Absolute: 0.1 10*3/uL (ref 0.0–0.1)
Basophils Relative: 1 %
Eosinophils Absolute: 0.1 10*3/uL (ref 0.0–0.5)
Eosinophils Relative: 2 %
HCT: 35.6 % — ABNORMAL LOW (ref 36.0–46.0)
Hemoglobin: 11.9 g/dL — ABNORMAL LOW (ref 12.0–15.0)
Immature Granulocytes: 0 %
Lymphocytes Relative: 20 %
Lymphs Abs: 0.9 10*3/uL (ref 0.7–4.0)
MCH: 30.8 pg (ref 26.0–34.0)
MCHC: 33.4 g/dL (ref 30.0–36.0)
MCV: 92.2 fL (ref 80.0–100.0)
Monocytes Absolute: 0.4 10*3/uL (ref 0.1–1.0)
Monocytes Relative: 8 %
Neutro Abs: 3.3 10*3/uL (ref 1.7–7.7)
Neutrophils Relative %: 69 %
Platelets: 222 10*3/uL (ref 150–400)
RBC: 3.86 MIL/uL — ABNORMAL LOW (ref 3.87–5.11)
RDW: 13.4 % (ref 11.5–15.5)
WBC: 4.7 10*3/uL (ref 4.0–10.5)
nRBC: 0 % (ref 0.0–0.2)

## 2018-09-01 LAB — COMPREHENSIVE METABOLIC PANEL
ALT: 10 U/L (ref 0–44)
AST: 14 U/L — ABNORMAL LOW (ref 15–41)
Albumin: 4.1 g/dL (ref 3.5–5.0)
Alkaline Phosphatase: 66 U/L (ref 38–126)
Anion gap: 10 (ref 5–15)
BUN: 11 mg/dL (ref 8–23)
CO2: 24 mmol/L (ref 22–32)
Calcium: 9 mg/dL (ref 8.9–10.3)
Chloride: 104 mmol/L (ref 98–111)
Creatinine, Ser: 0.83 mg/dL (ref 0.44–1.00)
GFR calc Af Amer: >60 mL/min (ref >60)
GFR calc non Af Amer: >60 mL/min (ref >60)
Glucose, Bld: 122 mg/dL — ABNORMAL HIGH (ref 70–99)
Potassium: 3.6 mmol/L (ref 3.5–5.1)
Sodium: 138 mmol/L (ref 135–145)
Total Bilirubin: 0.7 mg/dL (ref 0.3–1.2)
Total Protein: 6.3 g/dL — ABNORMAL LOW (ref 6.5–8.1)

## 2018-09-01 LAB — PROTIME-INR
INR: 1.1 (ref 0.8–1.2)
Prothrombin Time: 14.2 seconds (ref 11.4–15.2)

## 2018-09-01 LAB — APTT: aPTT: 28 seconds (ref 24–36)

## 2018-09-01 MED ORDER — PREDNISONE 20 MG PO TABS: 20.0000 mg | ORAL_TABLET | Freq: Two times a day (BID) | ORAL | 0 refills | Status: AC

## 2018-09-01 MED ORDER — DIPHENHYDRAMINE-ZINC ACETATE 2-0.1 % EX CREA: 1.0000 "application " | TOPICAL_CREAM | Freq: Three times a day (TID) | CUTANEOUS | 0 refills | Status: DC | PRN

## 2018-09-01 MED ORDER — FAMOTIDINE 20 MG PO TABS: 20.0000 mg | ORAL_TABLET | Freq: Every day | ORAL | 0 refills | Status: DC

## 2018-09-01 MED ORDER — PREDNISONE 20 MG PO TABS
60.0000 mg | ORAL_TABLET | Freq: Once | ORAL | Status: AC
  Administered 2018-09-01: 60 mg via ORAL
  Filled 2018-09-01: qty 3

## 2018-09-01 MED ORDER — FAMOTIDINE 20 MG PO TABS
20.0000 mg | ORAL_TABLET | Freq: Once | ORAL | Status: AC
  Administered 2018-09-01: 20 mg via ORAL
  Filled 2018-09-01: qty 1

## 2018-09-01 NOTE — ED Notes (Signed)
See triage note  Presents with possible allergic reaction  States she went to see ENT yesterday  Was told that she did not have an ear problem  Was told to take IBU otc  States she took some couple of hours PTA  Now has a slight rash to abd

## 2018-09-01 NOTE — ED Triage Notes (Addendum)
Pt to ED reporting she took ibuprofen 2 hours ago and has since been having an allergic reaction. Rash noted to pts abd. No SOB or swelling in her throat reported. Pt talking to staff without difficulty. Pt denies ever having taken ibuprofen in the past.

## 2018-09-01 NOTE — ED Provider Notes (Signed)
[facility] Emergency Department Provider Note  ____________________________________________  Time seen: Approximately 3:14 PM  I have reviewed the triage vital signs and the nursing notes.   HISTORY  Chief Complaint Allergic Reaction    HPI [name] is a 78 y.o. female presents to the emergency department with a new petechial rash of the abdomen and upper extremities after taking ibuprofen.  Patient states that she went to see her ENT physician yesterday and was diagnosed with TMJ type pain.  Patient reports that physician recommended ibuprofen.  Patient states that she has never taken ibuprofen in her life.  Patient noticed rash developing after 1 dose of ibuprofen.  Patient states that rash is mildly pruritic.  She denies shortness of breath, difficulty swallowing, chest tightness, nausea, vomiting or diarrhea.  No syncope.  Patient states that she has never experienced similar symptoms in the past.  No other alleviating measures have been attempted.        Past Medical History:  Diagnosis Date  . Thyroid disease     There are no active problems to display for this patient.   Past Surgical History:  Procedure Laterality Date  . ABDOMINAL HYSTERECTOMY    . FOOT SURGERY      Prior to Admission medications   Medication Sig Start Date End Date Taking? Authorizing Provider  diphenhydrAMINE-zinc acetate (BENADRYL EXTRA STRENGTH) cream Apply 1 application topically 3 (three) times daily as needed for itching. 09/01/18   Orvil Feil, PA-C  famotidine (PEPCID) 20 MG tablet Take 1 tablet (20 mg total) by mouth daily for 3 days. 09/01/18 09/04/18  Orvil Feil, PA-C  levothyroxine (SYNTHROID) 100 MCG tablet Take 100 mcg by mouth daily before breakfast.    [provider]  predniSONE (DELTASONE) 20 MG tablet Take 1 tablet (20 mg total) by mouth 2 (two) times a day for 3 days. 09/01/18 09/04/18  Orvil Feil, PA-C    Allergies Patient has no  known allergies.  History reviewed. No pertinent family history.  Social History Social History   Tobacco Use  . Smoking status: Current Every Day Smoker  . Smokeless tobacco: Never Used  Substance Use Topics  . Alcohol use: Not Currently  . Drug use: No     Review of Systems  Constitutional: No fever/chills Eyes: No visual changes. No discharge ENT: No upper respiratory complaints. Cardiovascular: no chest pain. Respiratory: no cough. No SOB. Gastrointestinal: No abdominal pain.  No nausea, no vomiting.  No diarrhea.  No constipation. Genitourinary: Negative for dysuria. No hematuria Musculoskeletal: Negative for musculoskeletal pain. Skin: Patient has rash.  Neurological: Negative for headaches, focal weakness or numbness.   ____________________________________________   PHYSICAL EXAM:  VITAL SIGNS: ED Triage Vitals  Enc Vitals Group     BP 09/01/18 1441 (!) 141/54     Pulse Rate 09/01/18 1441 84     Resp 09/01/18 1441 16     Temp 09/01/18 1441 98.2 F (36.8 C)     Temp Source 09/01/18 1441 Oral     SpO2 09/01/18 1441 98 %     Weight 09/01/18 1442 104 lb 15 oz (47.6 kg)     Height 09/01/18 1442 5\' 4"  (1.626 m)     Head Circumference --      Peak Flow --      Pain Score 09/01/18 1442 0     Pain Loc --      Pain Edu? --      Excl. in GC? --  Constitutional: Alert and oriented. Well appearing and in no acute distress. Eyes: Conjunctivae are normal. PERRL. EOMI. Head: Atraumatic. Cardiovascular: Normal rate, regular rhythm. Normal S1 and S2.  Good peripheral circulation. Respiratory: Normal respiratory effort without tachypnea or retractions. Lungs CTAB. Good air entry to the bases with no decreased or absent breath sounds. Gastrointestinal: Bowel sounds 4 quadrants. Soft and nontender to palpation. No guarding or rigidity. No palpable masses. No distention. No CVA tenderness. Musculoskeletal: Full range of motion to all extremities. No gross  deformities appreciated. Neurologic:  Normal speech and language. No gross focal neurologic deficits are appreciated.  Skin: Patient has macular, petechial-like rash of abdomen and forearms.  Rash is in an irregular distribution.  Bruising along forearms is also visualized.  No signs of excoriation. Psychiatric: Mood and affect are normal. Speech and behavior are normal. Patient exhibits appropriate insight and judgement.   ____________________________________________   LABS (all labs ordered are listed, but only abnormal results are displayed)  Labs Reviewed  CBC WITH DIFFERENTIAL/PLATELET - Abnormal; Notable for the following components:      Result Value   RBC 3.86 (*)    Hemoglobin 11.9 (*)    HCT 35.6 (*)    All other components within normal limits  COMPREHENSIVE METABOLIC PANEL - Abnormal; Notable for the following components:   Glucose, Bld 122 (*)    Total Protein 6.3 (*)    AST 14 (*)    All other components within normal limits  APTT  PROTIME-INR   ____________________________________________  EKG   ____________________________________________  RADIOLOGY   No results found.  ____________________________________________    PROCEDURES  Procedure(s) performed:    Procedures    Medications  predniSONE (DELTASONE) tablet 60 mg (60 mg Oral Given 09/01/18 1613)  famotidine (PEPCID) tablet 20 mg (20 mg Oral Given 09/01/18 1613)     ____________________________________________   INITIAL IMPRESSION / ASSESSMENT AND PLAN / ED COURSE  Pertinent labs & imaging results that were available during my care of the patient were reviewed by me and considered in my medical decision making (see chart for details).  Review of the [place] CSRS was performed in accordance of the [facility] prior to dispensing any controlled drugs.           Assessment and Plan:  Rash:  78 year old female presents to the emergency department with a new onset macular, pruritic rash of the  abdomen and forearms that started today after she took ibuprofen  On physical exam, rash is visualized.  Patient appeared comfortable.  Vital signs were reassuring in the emergency department.  Differential diagnosis included drug rash, thrombocytopenia and Henoch-Schnlein purpura..  Patient's platelets were within reference range on CBC.  Renal function was reassuring.  PT-INR and APTT were within reference range.  Patient was given 60 mg of prednisone in the emergency department and Pepcid.  She was discharged with 40 mg of prednisone daily for the next 3 days, 20 mg of Pepcid for the next 3 days and topical Benadryl for pruritus.  Strict return precautions were given to return to the emergency department for new or worsening symptoms.  Advised patient to stop taking ibuprofen and to take Tylenol for her TMJ type pain.  She voiced understanding.  All patient questions were answered.   ____________________________________________  FINAL CLINICAL IMPRESSION(S) / ED DIAGNOSES  Final diagnoses:  Rash      NEW MEDICATIONS STARTED DURING THIS VISIT:  ED Discharge Orders         Ordered  predniSONE (DELTASONE) 20 MG tablet  2 times daily     09/01/18 1618    famotidine (PEPCID) 20 MG tablet  Daily     09/01/18 1618    diphenhydrAMINE-zinc acetate (BENADRYL EXTRA STRENGTH) cream  3 times daily PRN     09/01/18 1618              This chart was dictated using voice recognition software/Dragon. Despite best efforts to proofread, errors can occur which can change the meaning. Any change was purely unintentional.    Orvil FeilWoods, Josecarlos Harriott M, PA-C 09/01/18 1623    Phineas SemenGoodman, [place], MD 09/01/18 [phone]

## 2018-09-19 DIAGNOSIS — R1313 Dysphagia, pharyngeal phase: Secondary | ICD-10-CM | POA: Insufficient documentation

## 2018-09-19 NOTE — Progress Notes (Signed)
 Otolaryngology Clinic Note  HPI:    [name] is a 78 y.o. female who presents as a new patient with a chief complaint of swallow problem.  She complains of difficulty swallowing.  She has to swallow small amounts.  She has no problem swallowing liquids.  Swallowing saliva takes more effort.  There is no throat pain.  Her swallow problem feels moderate.  Her doctor treated her with Pepcid  that seemed to help while she was taking it.  The problem came back thereafter.  She has lost a few pounds recently.  PMH/Meds/All/SocHx/FamHx/ROS:   Past Medical History:  Diagnosis Date  . Hypothyroid     Past Surgical History:  Procedure Laterality Date  . HYSTERECTOMY      No family history of bleeding disorders, wound healing problems or difficulty with anesthesia.   Social History   Socioeconomic History  . Marital status: Not on file    Spouse name: Not on file  . Number of children: Not on file  . Years of education: Not on file  . Highest education level: Not on file  Occupational History  . Not on file  Social Needs  . Financial resource strain: Not on file  . Food insecurity    Worry: Not on file    Inability: Not on file  . Transportation needs    Medical: Not on file    Non-medical: Not on file  Tobacco Use  . Smoking status: Current Every Day Smoker  . Smokeless tobacco: Never Used  Substance and Sexual Activity  . Alcohol use: Never    Frequency: Never  . Drug use: Not on file  . Sexual activity: Not on file  Lifestyle  . Physical activity    Days per week: Not on file    Minutes per session: Not on file  . Stress: Not on file  Relationships  . Social Musician on phone: Not on file    Gets together: Not on file    Attends religious service: Not on file    Active member of club or organization: Not on file    Attends meetings of clubs or organizations: Not on file    Relationship status: Not on file  Other Topics Concern  . Not on file   Social History Narrative  . Not on file     Current Outpatient Medications:  .  SYNTHROID  100 mcg tablet, TAKE 1 TABLET BY MOUTH EVERY MORNING, Disp: , Rfl:  .  famotidine  (PEPCID ) 20 MG tablet, TK 1 T PO QD FOR 3 DAYS, Disp: , Rfl:   A complete ROS was performed with pertinent positives/negatives noted in the HPI. The remainder of the ROS are negative.    Physical Exam:    BP 140/74   Ht 1.626 m (5' 4)   Wt 46.3 kg (102 lb)   BMI 17.51 kg/m   Appearance: alert, NAD, pleasant and cooperative Ability to communicate: normal voice Head and face: no skin lesions, no visible masses, normal structures Palpation of face: no masses, tenderness, or deformity Eyes: extra ocular movements intact, pupils equally round and reactive to light, no nystagmus Left ear: external ear normal, external auditory canal without substantial cerumen, tympanic membrane intact, middle ear aerated Right ear: external ear normal, external auditory canal without substantial cerumen, tympanic membrane intact, middle ear aerated Hearing: some difficulty understanding normal conversational speech Nose: external nose normal, septum relatively midline, mild inferior turbinate hypertrophy; no lesions, masses, polyps or exudates  Lips, teeth, gums: no lesions, edentulous with upper and lower dentures, healthy gums Oropharynx: mucosa without ulcers, masses or leukoplakia, normal tongue, tonsils 1+; pharyngeal walls normal Mirror exam of larynx: no mass or ulceration, vocal cords symmetrically mobile Mirror exam of nasopharynx: no mass or ulceration Neck: no mass or tenderness, normal neck landmarks Thyroid : no thyromegaly or mass Lymphatics: no cervical adenopathy Salivary glands: soft, symmetric, no masses Facial strength: normal and symmetric CN II-XII intact Respiratory: breath sounds symmetric and clear Cardiovascular: regular rate and rhythm  Independent Review of Additional Tests or Records:   None  Procedures:  None   Impression & Plans:  [name] is a 78 y.o. female with pharyngeal dysphagia.  - We discussed her problem at length.  It seems that reflux treatment improved her swallow difficulty.  Before committing to longer term therapy, I recommended we evaluate her swallow with a barium swallow.  I will call her with results.  Vaughan Ricker, MD Otolaryngology    Electronically signed by: Vaughan Alm Ricker, MD 09/19/18 1002

## 2018-10-07 ENCOUNTER — Emergency Department: Payer: Medicare Other

## 2018-10-07 ENCOUNTER — Encounter: Payer: Self-pay | Admitting: Emergency Medicine

## 2018-10-07 ENCOUNTER — Other Ambulatory Visit: Payer: Self-pay

## 2018-10-07 ENCOUNTER — Emergency Department
Admission: EM | Admit: 2018-10-07 | Discharge: 2018-10-07 | Disposition: A | Discharge status: Home / Self Care | Payer: Medicare Other | Attending: Emergency Medicine | Admitting: Emergency Medicine

## 2018-10-07 DIAGNOSIS — E079 Disorder of thyroid, unspecified: Secondary | ICD-10-CM | POA: Diagnosis not present

## 2018-10-07 DIAGNOSIS — F17210 Nicotine dependence, cigarettes, uncomplicated: Secondary | ICD-10-CM | POA: Diagnosis not present

## 2018-10-07 DIAGNOSIS — R1032 Left lower quadrant pain: Secondary | ICD-10-CM | POA: Insufficient documentation

## 2018-10-07 DIAGNOSIS — R102 Pelvic and perineal pain: Secondary | ICD-10-CM | POA: Diagnosis not present

## 2018-10-07 DIAGNOSIS — Z79899 Other long term (current) drug therapy: Secondary | ICD-10-CM | POA: Diagnosis not present

## 2018-10-07 DIAGNOSIS — R1030 Lower abdominal pain, unspecified: Secondary | ICD-10-CM

## 2018-10-07 LAB — URINALYSIS, COMPLETE (UACMP) WITH MICROSCOPIC
Bilirubin Urine: NEGATIVE
Glucose, UA: NEGATIVE mg/dL
Hgb urine dipstick: NEGATIVE
Ketones, ur: NEGATIVE mg/dL
Leukocytes,Ua: NEGATIVE
Nitrite: NEGATIVE
Protein, ur: NEGATIVE mg/dL
Specific Gravity, Urine: 1.003 — ABNORMAL LOW (ref 1.005–1.030)
pH: 7 (ref 5.0–8.0)

## 2018-10-07 LAB — CBC
HCT: 42.6 % (ref 36.0–46.0)
Hemoglobin: 13.4 g/dL (ref 12.0–15.0)
MCH: 29.9 pg (ref 26.0–34.0)
MCHC: 31.5 g/dL (ref 30.0–36.0)
MCV: 95.1 fL (ref 80.0–100.0)
Platelets: 258 10*3/uL (ref 150–400)
RBC: 4.48 MIL/uL (ref 3.87–5.11)
RDW: 13.2 % (ref 11.5–15.5)
WBC: 5.3 10*3/uL (ref 4.0–10.5)
nRBC: 0 % (ref 0.0–0.2)

## 2018-10-07 LAB — COMPREHENSIVE METABOLIC PANEL
ALT: 13 U/L (ref 0–44)
AST: 18 U/L (ref 15–41)
Albumin: 4.6 g/dL (ref 3.5–5.0)
Alkaline Phosphatase: 61 U/L (ref 38–126)
Anion gap: 11 (ref 5–15)
BUN: 9 mg/dL (ref 8–23)
CO2: 26 mmol/L (ref 22–32)
Calcium: 10 mg/dL (ref 8.9–10.3)
Chloride: 105 mmol/L (ref 98–111)
Creatinine, Ser: 0.81 mg/dL (ref 0.44–1.00)
GFR calc Af Amer: >60 mL/min (ref >60)
GFR calc non Af Amer: >60 mL/min (ref >60)
Glucose, Bld: 117 mg/dL — ABNORMAL HIGH (ref 70–99)
Potassium: 3.7 mmol/L (ref 3.5–5.1)
Sodium: 142 mmol/L (ref 135–145)
Total Bilirubin: 0.7 mg/dL (ref 0.3–1.2)
Total Protein: 7.4 g/dL (ref 6.5–8.1)

## 2018-10-07 LAB — LIPASE, BLOOD: Lipase: 33 U/L (ref 11–51)

## 2018-10-07 MED ORDER — SODIUM CHLORIDE 0.9% FLUSH: 3.0000 mL | Freq: Once | INTRAVENOUS | Status: DC

## 2018-10-07 MED ORDER — IOHEXOL 300 MG/ML  SOLN
75.0000 mL | Freq: Once | INTRAMUSCULAR | Status: AC | PRN
  Administered 2018-10-07: 75 mL via INTRAVENOUS

## 2018-10-07 NOTE — ED Triage Notes (Signed)
No BM x 2 weeks. Abdominal pain x 1 month.

## 2018-10-07 NOTE — ED Notes (Signed)
IV access attempted x 1 by this RN 

## 2018-10-07 NOTE — ED Provider Notes (Signed)
[facility] Emergency Department Provider Note   ____________________________________________    I have reviewed the triage vital signs and the nursing notes.   HISTORY  Chief Complaint Abdominal Pain     HPI [name] is a 78 y.o. female who presents with complaints of lower abdominal pain.  Pain is most prominent in the left lower quadrant and suprapubically.  She reports is been ongoing for several days, she was referred from urgent care for evaluation here.  She does report some constipation as well.  Denies fevers or chills.  No nausea or vomiting.  Has had a hysterectomy no other abdominal surgeries.  No dysuria.  No back pain.  No dizziness.  Past Medical History:  Diagnosis Date  . Thyroid disease     There are no active problems to display for this patient.   Past Surgical History:  Procedure Laterality Date  . ABDOMINAL HYSTERECTOMY    . FOOT SURGERY      Prior to Admission medications   Medication Sig Start Date End Date Taking? Authorizing Provider  diphenhydrAMINE-zinc acetate (BENADRYL EXTRA STRENGTH) cream Apply 1 application topically 3 (three) times daily as needed for itching. 09/01/18   Orvil FeilWoods, Jaclyn M, PA-C  famotidine (PEPCID) 20 MG tablet Take 1 tablet (20 mg total) by mouth daily for 3 days. 09/01/18 09/04/18  Orvil FeilWoods, Jaclyn M, PA-C  levothyroxine (SYNTHROID) 100 MCG tablet Take 100 mcg by mouth daily before breakfast.    [provider]     Allergies Patient has no known allergies.  No family history on file.  Social History Social History   Tobacco Use  . Smoking status: Current Every Day Smoker  . Smokeless tobacco: Never Used  Substance Use Topics  . Alcohol use: Not Currently  . Drug use: No    Review of Systems  Constitutional: No fever/chills Eyes: No visual changes.  ENT: No sore throat. Cardiovascular: Denies chest pain. Respiratory: Denies shortness of breath. Gastrointestinal: As  above Genitourinary: As above Musculoskeletal: Negative for back pain. Skin: Negative for rash. Neurological: Negative for headaches or weakness   ____________________________________________   PHYSICAL EXAM:  VITAL SIGNS: ED Triage Vitals  Enc Vitals Group     BP 10/07/18 0950 (!) 156/52     Pulse Rate 10/07/18 0950 66     Resp 10/07/18 0950 18     Temp 10/07/18 0950 98.2 F (36.8 C)     Temp Source 10/07/18 0950 Oral     SpO2 10/07/18 0950 100 %     Weight 10/07/18 0951 44.5 kg (98 lb)     Height 10/07/18 0951 1.626 m (5\' 4" )     Head Circumference --      Peak Flow --      Pain Score 10/07/18 0951 10     Pain Loc --      Pain Edu? --      Excl. in GC? --     Constitutional: Alert and oriented.  Eyes: Conjunctivae are normal.  Head: Atraumatic. Nose: No congestion/rhinnorhea. Mouth/Throat: Mucous membranes are moist.   Neck:  Painless ROM Cardiovascular: Normal rate, regular rhythm. Grossly normal heart sounds.  Good peripheral circulation. Respiratory: Normal respiratory effort.  No retractions. Lungs CTAB. Gastrointestinal: Tenderness palpation the left lower quadrant, no distention, no CVA tenderness  Musculoskeletal:  Warm and well perfused Neurologic:  Normal speech and language. No gross focal neurologic deficits are appreciated.  Skin:  Skin is warm, dry and intact. No rash  noted. Psychiatric: Mood and affect are normal. Speech and behavior are normal.  ____________________________________________   LABS (all labs ordered are listed, but only abnormal results are displayed)  Labs Reviewed  COMPREHENSIVE METABOLIC PANEL - Abnormal; Notable for the following components:      Result Value   Glucose, Bld 117 (*)    All other components within normal limits  URINALYSIS, COMPLETE (UACMP) WITH MICROSCOPIC - Abnormal; Notable for the following components:   Color, Urine YELLOW (*)    APPearance CLEAR (*)    Specific Gravity, Urine 1.003 (*)    Bacteria,  UA RARE (*)    All other components within normal limits  LIPASE, BLOOD  CBC   ____________________________________________  EKG  None ____________________________________________  RADIOLOGY  CT abdomen pelvis no acute distress ____________________________________________   PROCEDURES  Procedure(s) performed: No  Procedures   Critical Care performed: No ____________________________________________   INITIAL IMPRESSION / ASSESSMENT AND PLAN / ED COURSE  Pertinent labs & imaging results that were available during my care of the patient were reviewed by me and considered in my medical decision making (see chart for details).  Patient presents with left lower quadrant abdominal pain, she is tender in this area, lab work is overall unremarkable, suspicious for diverticulitis, will obtain imaging and reevaluate.  CT imaging is reassuring, no evidence of diverticulitis, lab work, urinalysis unremarkable as well.  Patient is appropriate for discharge at this time as she states she is feeling well and is anxious to leave.  Recommend close follow-up with PCP, return precautions discussed   ____________________________________________   FINAL CLINICAL IMPRESSION(S) / ED DIAGNOSES  Final diagnoses:  Lower abdominal pain        Note:  This document was prepared using Dragon voice recognition software and may include unintentional dictation errors.   Lavonia Drafts, MD 10/07/18 1242

## 2019-03-30 ENCOUNTER — Emergency Department
Admission: EM | Admit: 2019-03-30 | Discharge: 2019-03-30 | Disposition: A | Discharge status: Home / Self Care | Payer: Medicare Other | Attending: Emergency Medicine | Admitting: Emergency Medicine

## 2019-03-30 ENCOUNTER — Encounter: Payer: Self-pay | Admitting: Emergency Medicine

## 2019-03-30 ENCOUNTER — Emergency Department: Payer: Medicare Other

## 2019-03-30 ENCOUNTER — Other Ambulatory Visit: Payer: Self-pay

## 2019-03-30 DIAGNOSIS — M79604 Pain in right leg: Secondary | ICD-10-CM | POA: Diagnosis present

## 2019-03-30 DIAGNOSIS — K5900 Constipation, unspecified: Secondary | ICD-10-CM | POA: Diagnosis not present

## 2019-03-30 DIAGNOSIS — R1030 Lower abdominal pain, unspecified: Secondary | ICD-10-CM | POA: Insufficient documentation

## 2019-03-30 DIAGNOSIS — F17200 Nicotine dependence, unspecified, uncomplicated: Secondary | ICD-10-CM | POA: Diagnosis not present

## 2019-03-30 LAB — CBC WITH DIFFERENTIAL/PLATELET
Abs Immature Granulocytes: 0.03 10*3/uL (ref 0.00–0.07)
Basophils Absolute: 0 10*3/uL (ref 0.0–0.1)
Basophils Relative: 0 %
Eosinophils Absolute: 0 10*3/uL (ref 0.0–0.5)
Eosinophils Relative: 0 %
HCT: 36.4 % (ref 36.0–46.0)
Hemoglobin: 11.6 g/dL — ABNORMAL LOW (ref 12.0–15.0)
Immature Granulocytes: 1 %
Lymphocytes Relative: 7 %
Lymphs Abs: 0.4 10*3/uL — ABNORMAL LOW (ref 0.7–4.0)
MCH: 28.6 pg (ref 26.0–34.0)
MCHC: 31.9 g/dL (ref 30.0–36.0)
MCV: 89.9 fL (ref 80.0–100.0)
Monocytes Absolute: 0.7 10*3/uL (ref 0.1–1.0)
Monocytes Relative: 11 %
Neutro Abs: 4.8 10*3/uL (ref 1.7–7.7)
Neutrophils Relative %: 81 %
Platelets: 153 10*3/uL (ref 150–400)
RBC: 4.05 MIL/uL (ref 3.87–5.11)
RDW: 12.5 % (ref 11.5–15.5)
WBC: 6 10*3/uL (ref 4.0–10.5)
nRBC: 0 % (ref 0.0–0.2)

## 2019-03-30 LAB — COMPREHENSIVE METABOLIC PANEL
ALT: 12 U/L (ref 0–44)
AST: 18 U/L (ref 15–41)
Albumin: 3.9 g/dL (ref 3.5–5.0)
Alkaline Phosphatase: 59 U/L (ref 38–126)
Anion gap: 10 (ref 5–15)
BUN: 19 mg/dL (ref 8–23)
CO2: 22 mmol/L (ref 22–32)
Calcium: 8.9 mg/dL (ref 8.9–10.3)
Chloride: 105 mmol/L (ref 98–111)
Creatinine, Ser: 0.71 mg/dL (ref 0.44–1.00)
GFR calc Af Amer: >60 mL/min (ref >60)
GFR calc non Af Amer: >60 mL/min (ref >60)
Glucose, Bld: 126 mg/dL — ABNORMAL HIGH (ref 70–99)
Potassium: 4 mmol/L (ref 3.5–5.1)
Sodium: 137 mmol/L (ref 135–145)
Total Bilirubin: 0.7 mg/dL (ref 0.3–1.2)
Total Protein: 6.8 g/dL (ref 6.5–8.1)

## 2019-03-30 LAB — URINALYSIS, COMPLETE (UACMP) WITH MICROSCOPIC
Bacteria, UA: NONE SEEN
Bilirubin Urine: NEGATIVE
Glucose, UA: NEGATIVE mg/dL
Hgb urine dipstick: NEGATIVE
Ketones, ur: NEGATIVE mg/dL
Leukocytes,Ua: NEGATIVE
Nitrite: NEGATIVE
Protein, ur: NEGATIVE mg/dL
Specific Gravity, Urine: 1.024 (ref 1.005–1.030)
Squamous Epithelial / HPF: NONE SEEN (ref 0–5)
pH: 5 (ref 5.0–8.0)

## 2019-03-30 LAB — LIPASE, BLOOD: Lipase: 21 U/L (ref 11–51)

## 2019-03-30 MED ORDER — IOHEXOL 300 MG/ML  SOLN
75.0000 mL | Freq: Once | INTRAMUSCULAR | Status: AC | PRN
  Administered 2019-03-30: 75 mL via INTRAVENOUS

## 2019-03-30 MED ORDER — POLYETHYLENE GLYCOL 3350 17 G PO PACK: 17.0000 g | PACK | Freq: Every day | ORAL | 0 refills | Status: DC

## 2019-03-30 MED ORDER — KETOROLAC TROMETHAMINE 30 MG/ML IJ SOLN
15.0000 mg | Freq: Once | INTRAMUSCULAR | Status: AC
  Administered 2019-03-30: 14:00:00 15 mg via INTRAVENOUS
  Filled 2019-03-30: qty 1

## 2019-03-30 MED ORDER — SORBITOL 70 % SOLN
960.0000 mL | TOPICAL_OIL | Freq: Once | ORAL | Status: DC
  Filled 2019-03-30: qty 473

## 2019-03-30 MED ORDER — MORPHINE SULFATE (PF) 2 MG/ML IV SOLN
2.0000 mg | Freq: Once | INTRAVENOUS | Status: AC
  Administered 2019-03-30: 2 mg via INTRAVENOUS
  Filled 2019-03-30: qty 1

## 2019-03-30 MED ORDER — ONDANSETRON HCL 4 MG/2ML IJ SOLN
4.0000 mg | Freq: Once | INTRAMUSCULAR | Status: AC
  Administered 2019-03-30: 11:00:00 4 mg via INTRAVENOUS
  Filled 2019-03-30: qty 2

## 2019-03-30 MED ORDER — MORPHINE SULFATE (PF) 4 MG/ML IV SOLN
4.0000 mg | Freq: Once | INTRAVENOUS | Status: DC
  Filled 2019-03-30: qty 1

## 2019-03-30 MED ORDER — MORPHINE SULFATE (PF) 4 MG/ML IV SOLN
4.0000 mg | Freq: Once | INTRAVENOUS | Status: AC
  Administered 2019-03-30: 4 mg via INTRAVENOUS

## 2019-03-30 NOTE — ED Notes (Signed)
CBG charted on wrong patient

## 2019-03-30 NOTE — ED Notes (Signed)
Soap suds enema done per EDP. Some stool on end of enema tubing. Pt sitting on commode at this time.

## 2019-03-30 NOTE — ED Notes (Signed)
Patient transported to CT 

## 2019-03-30 NOTE — ED Triage Notes (Signed)
Pt here for right groin/lower abdominal pain starting yesterday.  Pain with movement of leg. EMS reports unable to bear weight.  Severe pain with palpation.  No urinary sx. Denies constipation.

## 2019-03-30 NOTE — ED Provider Notes (Signed)
[facility] Emergency Department Provider Note       Time seen: ----------------------------------------- 10:36 AM on 03/30/2019 ----------------------------------------- I have reviewed the triage vital signs and the nursing notes.  HISTORY   Chief Complaint Abdominal Pain   HPI [name] is a 79 y.o. female with a history of thyroid disease who presents to the ED for severe right upper leg pain and lower abdominal pain.  Patient states the pain started when she got to her son's house.  Seems to be in the right groin, has never had this before.  She denies fevers, chills or other complaints.  Past Medical History:  Diagnosis Date  . Thyroid disease     There are no problems to display for this patient.   Past Surgical History:  Procedure Laterality Date  . ABDOMINAL HYSTERECTOMY    . FOOT SURGERY      Allergies Patient has no known allergies.  Social History Social History   Tobacco Use  . Smoking status: Current Every Day Smoker  . Smokeless tobacco: Never Used  Substance Use Topics  . Alcohol use: Not Currently  . Drug use: No    Review of Systems Constitutional: Negative for fever. Cardiovascular: Negative for chest pain. Respiratory: Negative for shortness of breath. Gastrointestinal: Positive for abdominal pain Genitourinary: Positive for groin pain Musculoskeletal: Positive for back pain Skin: Negative for rash. Neurological: Negative for headaches, focal weakness or numbness.  All systems negative/normal/unremarkable except as stated in the HPI  ____________________________________________   PHYSICAL EXAM:  VITAL SIGNS: ED Triage Vitals  Enc Vitals Group     BP      Pulse      Resp      Temp      Temp src      SpO2      Weight      Height      Head Circumference      Peak Flow      Pain Score      Pain Loc      Pain Edu?      Excl. in GC?    Constitutional: Alert and oriented.  Mild distress from  pain Eyes: Conjunctivae are normal. Normal extraocular movements. ENT      Head: Normocephalic and atraumatic.      Nose: No congestion/rhinnorhea.      Mouth/Throat: Mucous membranes are moist.      Neck: No stridor. Cardiovascular: Normal rate, regular rhythm. No murmurs, rubs, or gallops. Respiratory: Normal respiratory effort without tachypnea nor retractions. Breath sounds are clear and equal bilaterally. No wheezes/rales/rhonchi. Gastrointestinal: Right lower quadrant tenderness, McBurney's point tenderness, normal bowel sounds, patient has rebound tenderness Musculoskeletal: Pain with range of motion of the right leg, there is proximal right leg tenderness as well Neurologic:  Normal speech and language. No gross focal neurologic deficits are appreciated.  Skin:  Skin is warm, dry and intact. No rash noted. Psychiatric: Mood and affect are normal. Speech and behavior are normal.   ____________________________________________  ED COURSE:  As part of my medical decision making, I reviewed the following data within the electronic MEDICAL RECORD NUMBER History obtained from family if available, nursing notes, old chart and ekg, as well as notes from prior ED visits. Patient presented for abdominal pain and right hip pain, we will assess with labs and imaging as indicated at this time.   Procedures  RENISHA COCKRUM was evaluated in Emergency Department on 03/30/2019 for the symptoms described  in the history of present illness. She was evaluated in the context of the global COVID-19 pandemic, which necessitated consideration that the patient might be at risk for infection with the SARS-CoV-2 virus that causes COVID-19. Institutional protocols and algorithms that pertain to the evaluation of patients at risk for COVID-19 are in a state of rapid change based on information released by regulatory bodies including the CDC and federal and state organizations. These policies and algorithms were followed  during the patient's care in the ED.  ____________________________________________   LABS (pertinent positives/negatives)  Labs Reviewed  CBC WITH DIFFERENTIAL/PLATELET - Abnormal; Notable for the following components:      Result Value   Hemoglobin 11.6 (*)    Lymphs Abs 0.4 (*)    All other components within normal limits  COMPREHENSIVE METABOLIC PANEL - Abnormal; Notable for the following components:   Glucose, Bld 126 (*)    All other components within normal limits  URINALYSIS, COMPLETE (UACMP) WITH MICROSCOPIC - Abnormal; Notable for the following components:   Color, Urine YELLOW (*)    APPearance CLEAR (*)    All other components within normal limits  GLUCOSE, CAPILLARY - Abnormal; Notable for the following components:   Glucose-Capillary 166 (*)    All other components within normal limits  LIPASE, BLOOD    RADIOLOGY Images were viewed by me  CT of the abdomen pelvis with contrast IMPRESSION:  1. Large colonic stool burden without evidence of enteric  obstruction. Otherwise, no explanation patient's right groin/lower  quadrant abdominal pain.  2. Nonvisualization of the appendix but without pericecal  inflammatory change, similar to previous examinations.  3. 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  4. Aortic Atherosclerosis (ICD10-I70.0).  5. Solitary punctate (approximately 3 mm) nonobstructing right-sided  renal stone.  ____________________________________________   DIFFERENTIAL DIAGNOSIS   Appendicitis, abscess, septic arthritis, pelvic fracture, pelvic infection, sciatica  FINAL ASSESSMENT AND PLAN  Abdominal pain, constipation   Plan: The patient had presented for lower abdominal pain. Patient's labs were reassuring. Patient's imaging did not reveal any acute process.  She did have a large colonic stool burden on CT.  I have ordered an enema for her which was very successful and her symptoms have resolved.  She will be  discharged home on the [facility].   Laurence Aly, MD    Note: This note was generated in part or whole with voice recognition software. Voice recognition is usually quite accurate but there are transcription errors that can and very often do occur. I apologize for any typographical errors that were not detected and corrected.     Earleen Newport, MD 03/30/19 [phone]

## 2019-03-30 NOTE — ED Notes (Signed)

## 2019-03-30 NOTE — ED Notes (Signed)
Pt states large BM after enema. Feels much better. Will report to EDP

## 2019-04-02 LAB — GLUCOSE, CAPILLARY: Glucose-Capillary: 166 mg/dL — ABNORMAL HIGH (ref 70–99)

## 2019-07-02 IMAGING — CR DG LUMBAR SPINE COMPLETE 4+V
5 series · 5 of 5 positions shown · non-contrast
Comparison: None.

CLINICAL DATA: Left and right flank pain starting today.

EXAM:
LUMBAR SPINE - COMPLETE 4+ VIEW

[l-spine ap]
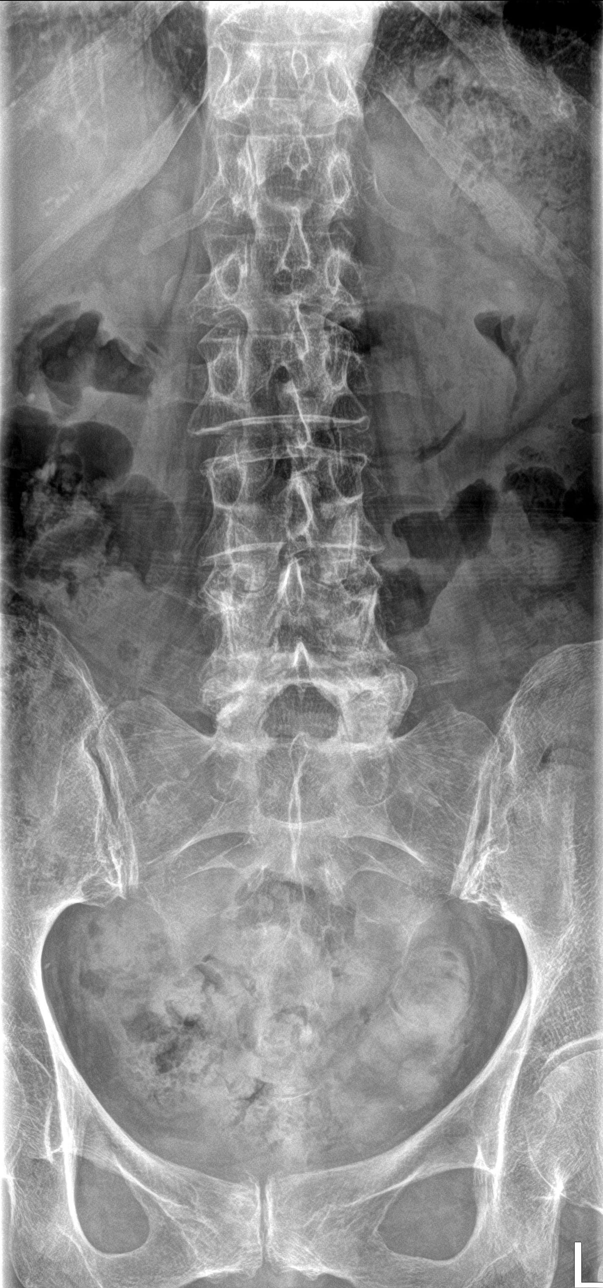

[l-spine obl (1 of 2)]
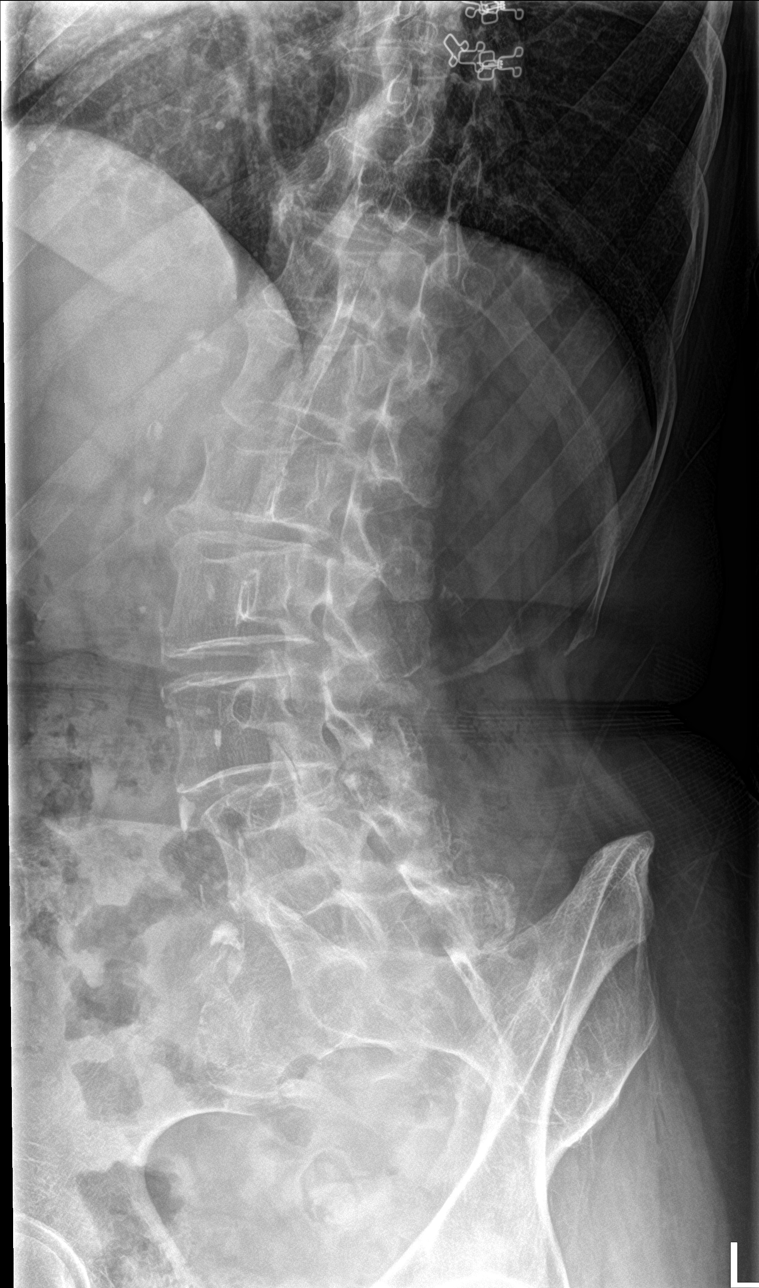

[l-spine obl (2 of 2)]
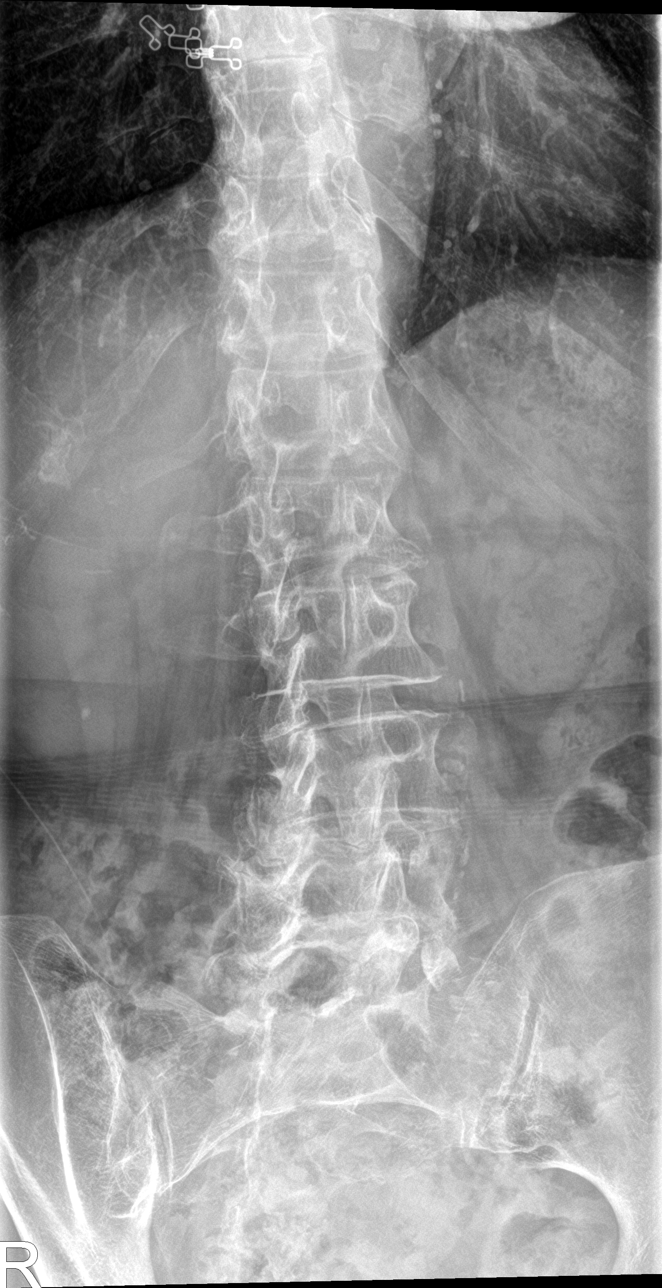

[l-spine lat]
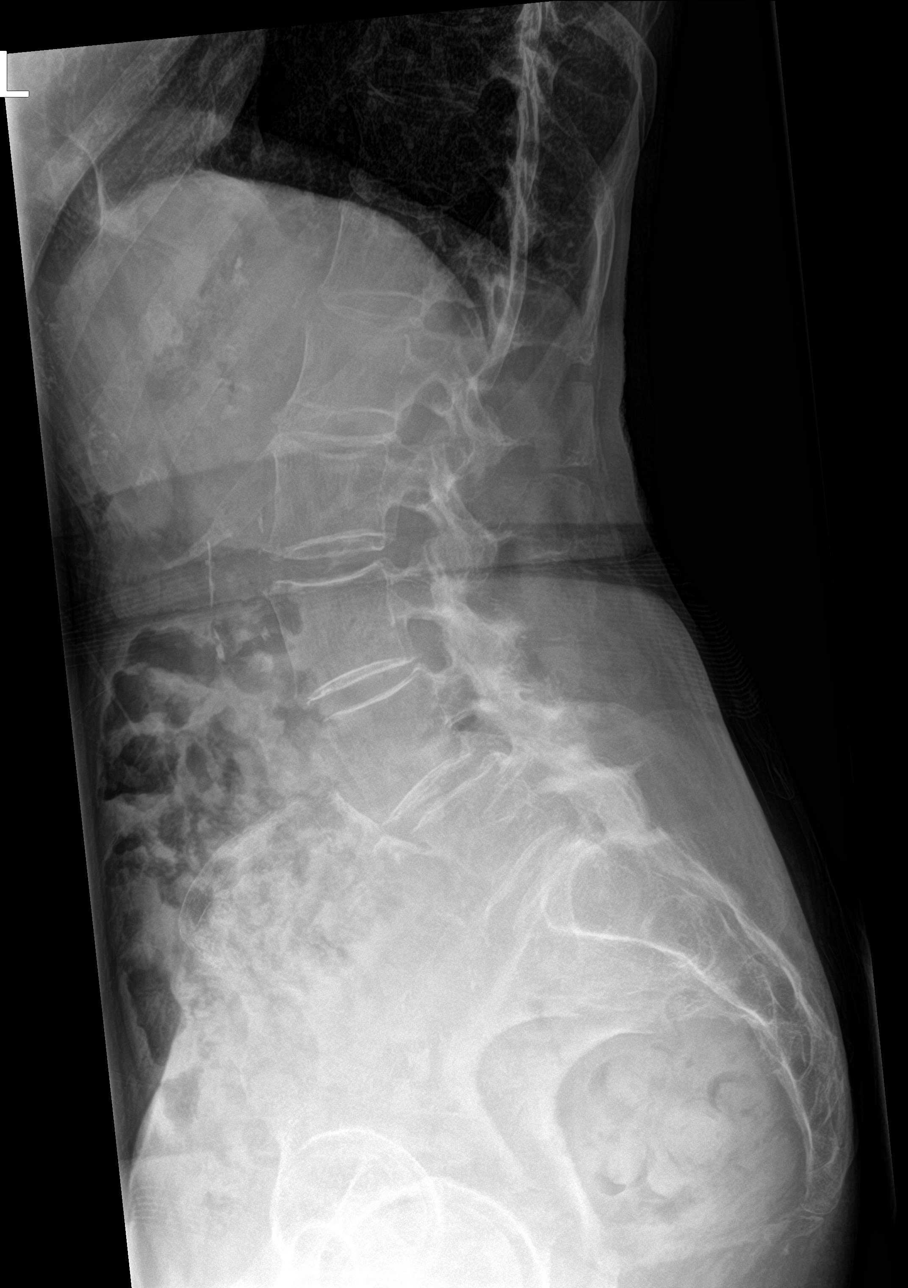

[l-spine spot]
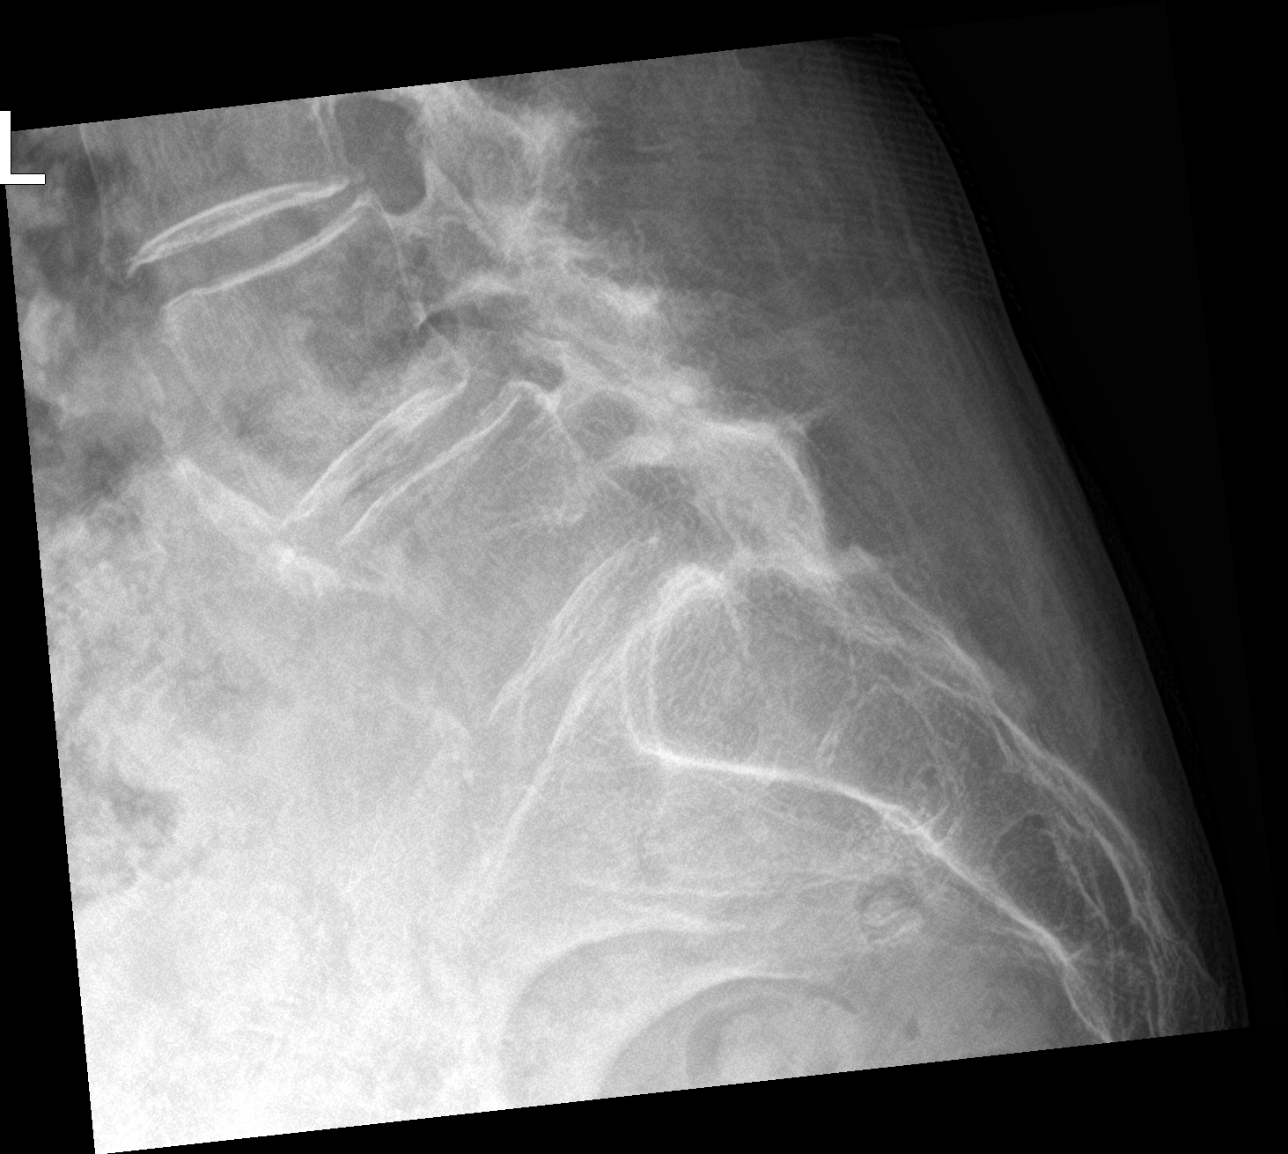

[5 of 5 positions shown; findings below may reference images not displayed]

FINDINGS: Five non ribbed lumbar vertebrae in maintained lumbar lordosis.
Facet arthropathy with sclerosis and joint space narrowing is
identified from L3 through S1. Minimal grade 1 anterolisthesis of L4
on L5. The bones are demineralized without acute fracture.
Degenerative disc disease with moderate disc flattening is
identified at L4-5. The included SI joints are congruent.
IMPRESSION: 1. No acute fracture.
2. Grade 1 anterolisthesis of L4 on L5 with associated moderate disc
flattening.
3. Degenerative facet arthropathy L3 through S1.  No pars defects.

## 2019-11-08 DIAGNOSIS — R413 Other amnesia: Secondary | ICD-10-CM | POA: Insufficient documentation

## 2020-12-08 ENCOUNTER — Other Ambulatory Visit: Payer: Self-pay

## 2020-12-08 ENCOUNTER — Inpatient Hospital Stay: Payer: Medicare Other

## 2020-12-08 ENCOUNTER — Encounter: Payer: Self-pay | Admitting: Oncology

## 2020-12-08 ENCOUNTER — Inpatient Hospital Stay: Payer: Medicare Other | Attending: Oncology | Admitting: Oncology

## 2020-12-08 VITALS — BP 110/65 | HR 75 | Temp 97.1°F | Resp 16 | Wt 103.7 lb

## 2020-12-08 DIAGNOSIS — Z79899 Other long term (current) drug therapy: Secondary | ICD-10-CM | POA: Diagnosis not present

## 2020-12-08 DIAGNOSIS — D72819 Decreased white blood cell count, unspecified: Secondary | ICD-10-CM | POA: Diagnosis present

## 2020-12-08 NOTE — Progress Notes (Signed)
Hematology/Oncology Consult note [facility] Telephone:([phone] [phone]  Patient Care Team: Jaclyn Shaggy, MD as PCP - General (Internal Medicine)   Name of the patient: [name]  [id number]  06/29/1950    Reason for referral-leukopenia   Referring physician-Dr. Arlana Pouch  Date of visit: 12/08/20   History of presenting illness-patient is a 80 year old female with a past medical history significant for osteoarthritis, anxiety, thyroid disorder and memory issues referred for leukopenia.  I do not have any recent CBC that was done for her.  We have tried to get in touch with Dr. Maree Krabbe office but we have not received any of her recent labs.  I am therefore unable to comment on her leukopenia.  Her CBC over the last 3 years has shown a normal white cell count.  Hemoglobin has fluctuated between 11.5-13 and platelet counts have been normal.  Patient is here with her son today and reports doing well overall.  She constantly expresses desire to go back home as soon as possible.  No unintentional weight loss or drenching night sweats.  ECOG PS- 1  Pain scale- 0   Review of systems- Review of Systems  Constitutional:  Negative for chills, fever, malaise/fatigue and weight loss.  HENT:  Negative for congestion, ear discharge and nosebleeds.   Eyes:  Negative for blurred vision.  Respiratory:  Negative for cough, hemoptysis, sputum production, shortness of breath and wheezing.   Cardiovascular:  Negative for chest pain, palpitations, orthopnea and claudication.  Gastrointestinal:  Negative for abdominal pain, blood in stool, constipation, diarrhea, heartburn, melena, nausea and vomiting.  Genitourinary:  Negative for dysuria, flank pain, frequency, hematuria and urgency.  Musculoskeletal:  Negative for back pain, joint pain and myalgias.  Skin:  Negative for rash.  Neurological:  Negative for dizziness, tingling, focal weakness, seizures, weakness and  headaches.  Endo/Heme/Allergies:  Does not bruise/bleed easily.  Psychiatric/Behavioral:  Negative for depression and suicidal ideas. The patient does not have insomnia.    No Known Allergies  Patient Active Problem List   Diagnosis Date Noted   Loss of memory 11/08/2019   Pharyngeal dysphagia 09/19/2018   Idiopathic osteoarthritis 11/02/2017     Past Medical History:  Diagnosis Date   Thyroid disease      Past Surgical History:  Procedure Laterality Date   ABDOMINAL HYSTERECTOMY     FOOT SURGERY      Social History   Socioeconomic History   Marital status: Widowed    Spouse name: Not on file   Number of children: Not on file   Years of education: Not on file   Highest education level: Not on file  Occupational History   Not on file  Tobacco Use   Smoking status: Every Day   Smokeless tobacco: Never  Vaping Use   Vaping Use: Never used  Substance and Sexual Activity   Alcohol use: Not Currently   Drug use: No   Sexual activity: Not on file  Other Topics Concern   Not on file  Social History Narrative   Not on file   Social Determinants of Health   Financial Resource Strain: Not on file  Food Insecurity: Not on file  Transportation Needs: Not on file  Physical Activity: Not on file  Stress: Not on file  Social Connections: Not on file  Intimate Partner Violence: Not on file     No family history on file.   Current Outpatient Medications:    diphenhydrAMINE-zinc acetate (BENADRYL EXTRA  STRENGTH) cream, Apply 1 application topically 3 (three) times daily as needed for itching., Disp: 28.4 g, Rfl: 0   famotidine (PEPCID) 20 MG tablet, Take 1 tablet (20 mg total) by mouth daily for 3 days., Disp: 60 tablet, Rfl: 0   levothyroxine (SYNTHROID) 100 MCG tablet, Take 100 mcg by mouth daily before breakfast., Disp: , Rfl:    meloxicam (MOBIC) 15 MG tablet, Mobic 15 mg tablet  Take 1 tablet every day by oral route., Disp: , Rfl:    aspirin 81 MG EC tablet,  Take by mouth. (Patient not taking: Reported on 12/08/2020), Disp: , Rfl:    polyethylene glycol (MIRALAX / GLYCOLAX) 17 g packet, Take 17 g by mouth daily. (Patient not taking: Reported on 12/08/2020), Disp: 14 each, Rfl: 0   triamcinolone cream (KENALOG) 0.1 %, Apply topically 2 (two) times daily. (Patient not taking: Reported on 12/08/2020), Disp: , Rfl:    Physical exam:  Vitals:   12/08/20 1253  BP: 110/65  Pulse: 75  Resp: 16  Temp: (!) 97.1 F (36.2 C)  SpO2: 97%  Weight: 103 lb 11.2 oz (47 kg)   Physical Exam Constitutional:      General: She is not in acute distress. Cardiovascular:     Rate and Rhythm: Normal rate and regular rhythm.     Heart sounds: Normal heart sounds.  Pulmonary:     Effort: Pulmonary effort is normal.     Breath sounds: Normal breath sounds.  Abdominal:     General: Bowel sounds are normal.     Palpations: Abdomen is soft.  Skin:    General: Skin is warm and dry.  Neurological:     Mental Status: She is alert and oriented to person, place, and time.       CMP Latest Ref Rng & Units 03/30/2019  Glucose 70 - 99 mg/dL 037(C)  BUN 8 - 23 mg/dL 19  Creatinine 4.88 - 8.91 mg/dL 6.94  Sodium 503 - 888 mmol/L 137  Potassium 3.5 - 5.1 mmol/L 4.0  Chloride 98 - 111 mmol/L 105  CO2 22 - 32 mmol/L 22  Calcium 8.9 - 10.3 mg/dL 8.9  Total Protein 6.5 - 8.1 g/dL 6.8  Total Bilirubin 0.3 - 1.2 mg/dL 0.7  Alkaline Phos 38 - 126 U/L 59  AST 15 - 41 U/L 18  ALT 0 - 44 U/L 12   CBC Latest Ref Rng & Units 03/30/2019  WBC 4.0 - 10.5 K/uL 6.0  Hemoglobin 12.0 - 15.0 g/dL 11.6(L)  Hematocrit 36.0 - 46.0 % 36.4  Platelets 150 - 400 K/uL 153     Assessment and plan- Patient is a 80 y.o. female referred for leukopenia  Unfortunately I do not have any recent labs that patient has had in the last 1 month to comment on her leukopenia.  Her prior CBCs for the last 3 to 4 years showed normal white cell count with mild anemia and normal platelet count.  I did  discuss getting a repeat CBC with differential and additional work-up today.  Patient's son prefers to wait for labs to arrive from Dr. Maree Krabbe office and then decide about further work-up based on those labs.  We will reach out to her after we get those labs.  Clinically patient has no evidence of palpable adenopathy or splenomegaly.   Thank you for this kind referral and the opportunity to participate in the care of this patient   Visit Diagnosis 1. Leukopenia, unspecified type  Dr. Owens Shark, MD, MPH [facility] at [facility] [phone] 12/08/2020

## 2020-12-09 ENCOUNTER — Other Ambulatory Visit: Payer: Self-pay | Admitting: Oncology

## 2020-12-09 DIAGNOSIS — R7989 Other specified abnormal findings of blood chemistry: Secondary | ICD-10-CM

## 2020-12-09 DIAGNOSIS — E039 Hypothyroidism, unspecified: Secondary | ICD-10-CM

## 2020-12-09 DIAGNOSIS — D709 Neutropenia, unspecified: Secondary | ICD-10-CM

## 2020-12-09 NOTE — Progress Notes (Signed)
error 

## 2020-12-12 ENCOUNTER — Other Ambulatory Visit: Payer: Self-pay | Admitting: *Deleted

## 2020-12-12 ENCOUNTER — Inpatient Hospital Stay: Payer: Medicare Other

## 2020-12-12 DIAGNOSIS — R7989 Other specified abnormal findings of blood chemistry: Secondary | ICD-10-CM

## 2020-12-12 DIAGNOSIS — D709 Neutropenia, unspecified: Secondary | ICD-10-CM

## 2020-12-12 DIAGNOSIS — D72819 Decreased white blood cell count, unspecified: Secondary | ICD-10-CM

## 2020-12-12 DIAGNOSIS — E039 Hypothyroidism, unspecified: Secondary | ICD-10-CM

## 2020-12-12 LAB — CBC WITH DIFFERENTIAL/PLATELET
Abs Immature Granulocytes: 0 10*3/uL (ref 0.00–0.07)
Basophils Absolute: 0 10*3/uL (ref 0.0–0.1)
Basophils Relative: 2 %
Eosinophils Absolute: 0 10*3/uL (ref 0.0–0.5)
Eosinophils Relative: 3 %
HCT: 35.6 % — ABNORMAL LOW (ref 36.0–46.0)
Hemoglobin: 11.5 g/dL — ABNORMAL LOW (ref 12.0–15.0)
Immature Granulocytes: 0 %
Lymphocytes Relative: 57 %
Lymphs Abs: 0.4 10*3/uL — ABNORMAL LOW (ref 0.7–4.0)
MCH: 27.6 pg (ref 26.0–34.0)
MCHC: 32.3 g/dL (ref 30.0–36.0)
MCV: 85.6 fL (ref 80.0–100.0)
Monocytes Absolute: 0.2 10*3/uL (ref 0.1–1.0)
Monocytes Relative: 25 %
Neutro Abs: 0.1 10*3/uL — CL (ref 1.7–7.7)
Neutrophils Relative %: 13 %
Platelets: 136 10*3/uL — ABNORMAL LOW (ref 150–400)
RBC: 4.16 MIL/uL (ref 3.87–5.11)
RDW: 15.2 % (ref 11.5–15.5)
WBC: 0.6 10*3/uL — CL (ref 4.0–10.5)
nRBC: 0 % (ref 0.0–0.2)

## 2020-12-12 LAB — TSH: TSH: 0.167 u[IU]/mL — ABNORMAL LOW (ref 0.350–4.500)

## 2020-12-12 LAB — FOLATE: Folate: 13.8 ng/mL (ref >5.9)

## 2020-12-12 LAB — HEPATITIS C ANTIBODY: HCV Ab: NONREACTIVE

## 2020-12-12 LAB — PATHOLOGIST SMEAR REVIEW

## 2020-12-12 LAB — VITAMIN B12: Vitamin B-12: 174 pg/mL — ABNORMAL LOW (ref 180–914)

## 2020-12-13 LAB — ANA COMPREHENSIVE PANEL

## 2020-12-14 LAB — COPPER, SERUM: Copper: 110 ug/dL (ref 80–158)

## 2020-12-16 ENCOUNTER — Telehealth: Payer: Self-pay | Admitting: *Deleted

## 2020-12-16 NOTE — Telephone Encounter (Signed)
Called and spoke to son Gerlene Burdock which has to help his Mom to transport her to visits and cares for all her medical visits. I went over the ct guided bone marrow bx will be 7/27 a Tuesday and she will need to [facility] at [facility] [place]. She will need to go to  registration desk. After she checks in then the staff will take her to special procedures and she will be there from 2-4 hours depending on how long it takes to wake up and be able to drink fluids. She has to be NPO 8 hours prior to the appt. She will stop any food and fluids after midnight of MOnday. She can take pills if necessary with small amount of water only. Since I know he is the driver for her he will take her back home. I told him that we will need to make a md visit after the results are back from bx. He is free the following week after bx on wed or Thursday in person. I will check with rao in am.son understands

## 2020-12-19 NOTE — Progress Notes (Signed)
Patient on schedule for BMB 12/23/2020, called and spoke with son on phone with pre procedure instructions given. Made aware to be here @ 0800, NPO after MN prior to procedure and driver post procedure/recovery/discharge. Stated understanding.

## 2020-12-22 ENCOUNTER — Other Ambulatory Visit: Payer: Self-pay | Admitting: Radiology

## 2020-12-23 ENCOUNTER — Other Ambulatory Visit: Payer: Self-pay

## 2020-12-23 ENCOUNTER — Ambulatory Visit
Admission: RE | Admit: 2020-12-23 | Discharge: 2020-12-23 | Disposition: A | Discharge status: Home / Self Care | Payer: Medicare Other | Source: Ambulatory Visit | Attending: Oncology | Admitting: Oncology

## 2020-12-23 DIAGNOSIS — D72819 Decreased white blood cell count, unspecified: Secondary | ICD-10-CM | POA: Insufficient documentation

## 2020-12-23 DIAGNOSIS — F419 Anxiety disorder, unspecified: Secondary | ICD-10-CM | POA: Diagnosis not present

## 2020-12-23 DIAGNOSIS — E079 Disorder of thyroid, unspecified: Secondary | ICD-10-CM | POA: Diagnosis not present

## 2020-12-23 DIAGNOSIS — Z79899 Other long term (current) drug therapy: Secondary | ICD-10-CM | POA: Diagnosis not present

## 2020-12-23 DIAGNOSIS — Z791 Long term (current) use of non-steroidal anti-inflammatories (NSAID): Secondary | ICD-10-CM | POA: Diagnosis not present

## 2020-12-23 DIAGNOSIS — M1990 Unspecified osteoarthritis, unspecified site: Secondary | ICD-10-CM | POA: Diagnosis not present

## 2020-12-23 DIAGNOSIS — D72820 Lymphocytosis (symptomatic): Secondary | ICD-10-CM | POA: Insufficient documentation

## 2020-12-23 DIAGNOSIS — D649 Anemia, unspecified: Secondary | ICD-10-CM | POA: Insufficient documentation

## 2020-12-23 DIAGNOSIS — D72822 Plasmacytosis: Secondary | ICD-10-CM | POA: Diagnosis not present

## 2020-12-23 DIAGNOSIS — F17210 Nicotine dependence, cigarettes, uncomplicated: Secondary | ICD-10-CM | POA: Diagnosis not present

## 2020-12-23 LAB — CBC WITH DIFFERENTIAL/PLATELET
Abs Immature Granulocytes: 0 10*3/uL (ref 0.00–0.07)
Basophils Absolute: 0 10*3/uL (ref 0.0–0.1)
Basophils Relative: 2 %
Eosinophils Absolute: 0 10*3/uL (ref 0.0–0.5)
Eosinophils Relative: 3 %
HCT: 37.1 % (ref 36.0–46.0)
Hemoglobin: 12.1 g/dL (ref 12.0–15.0)
Immature Granulocytes: 0 %
Lymphocytes Relative: 48 %
Lymphs Abs: 0.4 10*3/uL — ABNORMAL LOW (ref 0.7–4.0)
MCH: 27.6 pg (ref 26.0–34.0)
MCHC: 32.6 g/dL (ref 30.0–36.0)
MCV: 84.5 fL (ref 80.0–100.0)
Monocytes Absolute: 0.2 10*3/uL (ref 0.1–1.0)
Monocytes Relative: 21 %
Neutro Abs: 0.2 10*3/uL — CL (ref 1.7–7.7)
Neutrophils Relative %: 26 %
Platelets: 146 10*3/uL — ABNORMAL LOW (ref 150–400)
RBC: 4.39 MIL/uL (ref 3.87–5.11)
RDW: 14.8 % (ref 11.5–15.5)
Smear Review: NORMAL
WBC: 0.9 10*3/uL — CL (ref 4.0–10.5)
nRBC: 0 % (ref 0.0–0.2)

## 2020-12-23 MED ORDER — FENTANYL CITRATE (PF) 100 MCG/2ML IJ SOLN
INTRAMUSCULAR | Status: AC
  Filled 2020-12-23: qty 2

## 2020-12-23 MED ORDER — SODIUM CHLORIDE 0.9 % IV SOLN: INTRAVENOUS | Status: DC

## 2020-12-23 MED ORDER — MIDAZOLAM HCL 2 MG/2ML IJ SOLN
INTRAMUSCULAR | Status: AC
  Filled 2020-12-23: qty 2

## 2020-12-23 MED ORDER — HEPARIN SOD (PORK) LOCK FLUSH 100 UNIT/ML IV SOLN
INTRAVENOUS | Status: AC
  Filled 2020-12-23: qty 5

## 2020-12-23 MED ORDER — FENTANYL CITRATE (PF) 100 MCG/2ML IJ SOLN
INTRAMUSCULAR | Status: DC | PRN
  Administered 2020-12-23 (×3): 25 ug via INTRAVENOUS

## 2020-12-23 MED ORDER — MIDAZOLAM HCL 2 MG/2ML IJ SOLN
INTRAMUSCULAR | Status: DC | PRN
  Administered 2020-12-23 (×3): .5 mg via INTRAVENOUS

## 2020-12-23 NOTE — Consult Note (Signed)
Chief Complaint: Patient was seen in consultation today for leukoepnia at the request of Rao,Archana C  Referring Physician(s): Rao,Archana C  Patient Status: [facility] - Out-pt  History of Present Illness: TIMIYAH ROMITO is a 80 y.o. female with a past medical history significant for osteoarthritis, anxiety, thyroid disorder and memory issues and leukopenia presents to IR for Bone marrow biopsy and aspiration.  She feels generally well today.  She is eager to move forward with the procedure.  She denies chest or abdominal pain.  She is accompanied by her son today.  Past Medical History:  Diagnosis Date   Thyroid disease     Past Surgical History:  Procedure Laterality Date   ABDOMINAL HYSTERECTOMY     FOOT SURGERY      Allergies: Patient has no known allergies.  Medications: Prior to Admission medications   Medication Sig Start Date End Date Taking? Authorizing Provider  levothyroxine (SYNTHROID) 100 MCG tablet Take 100 mcg by mouth daily before breakfast.   Yes [provider]  aspirin 81 MG EC tablet Take by mouth. Patient not taking: Reported on 12/08/2020    [provider]  diphenhydrAMINE-zinc acetate (BENADRYL EXTRA STRENGTH) cream Apply 1 application topically 3 (three) times daily as needed for itching. 09/01/18   Lannie Fields, PA-C  famotidine (PEPCID) 20 MG tablet Take 1 tablet (20 mg total) by mouth daily for 3 days. 09/01/18 12/08/20  Lannie Fields, PA-C  meloxicam (MOBIC) 15 MG tablet Mobic 15 mg tablet  Take 1 tablet every day by oral route.    [provider]  polyethylene glycol (MIRALAX / GLYCOLAX) 17 g packet Take 17 g by mouth daily. Patient not taking: Reported on 12/08/2020 03/30/19   Earleen Newport, MD  triamcinolone cream (KENALOG) 0.1 % Apply topically 2 (two) times daily. Patient not taking: Reported on 12/08/2020 08/22/20   [provider]     History reviewed. No pertinent family history.  Social History    Socioeconomic History   Marital status: Widowed    Spouse name: Not on file   Number of children: Not on file   Years of education: Not on file   Highest education level: Not on file  Occupational History   Not on file  Tobacco Use   Smoking status: Every Day   Smokeless tobacco: Never  Vaping Use   Vaping Use: Never used  Substance and Sexual Activity   Alcohol use: Not Currently   Drug use: No   Sexual activity: Not on file  Other Topics Concern   Not on file  Social History Narrative   Not on file   Social Determinants of Health   Financial Resource Strain: Not on file  Food Insecurity: Not on file  Transportation Needs: Not on file  Physical Activity: Not on file  Stress: Not on file  Social Connections: Not on file   Review of Systems: A 12 point ROS discussed and pertinent positives are indicated in the HPI above.  All other systems are negative.  Review of Systems  Vital Signs: BP (!) 161/60   Pulse 60   Temp 98.3 F (36.8 C) (Oral)   Resp 17   Ht _0  (1.6 m)   Wt 46.7 kg   SpO2 100%   BMI 18.25 kg/m   Physical Exam Constitutional:      Appearance: Normal appearance.  Cardiovascular:     Rate and Rhythm: Normal rate and regular rhythm.  Pulmonary:  Effort: Pulmonary effort is normal.     Breath sounds: Normal breath sounds.  Abdominal:     General: There is no distension.     Palpations: Abdomen is soft.  Skin:    General: Skin is warm and dry.  Neurological:     Mental Status: She is alert.  Psychiatric:        Mood and Affect: Mood normal.    Imaging: No results found.  Labs:  CBC: Recent Labs    12/12/20 1005 12/23/20 0818  WBC 0.6* PENDING  HGB 11.5* 12.1  HCT 35.6* 37.1  PLT 136* 146*    COAGS: No results for input(s): INR, APTT in the last 8760 hours.  BMP: No results for input(s): NA, K, CL, CO2, GLUCOSE, BUN, CALCIUM, CREATININE, GFRNONAA, GFRAA in the last 8760 hours.  Invalid input(s): CMP  LIVER  FUNCTION TESTS: No results for input(s): BILITOT, AST, ALT, ALKPHOS, PROT, ALBUMIN in the last 8760 hours.  TUMOR MARKERS: No results for input(s): AFPTM, CEA, CA199, CHROMGRNA in the last 8760 hours.  Assessment and Plan:  80 year old woman with history of leukopenia presents to IR for Bone marrow biopsy and aspiration.  Risks and benefits of Bone marrow biopsy and aspiration was discussed with the patient and her son including, but not limited to bleeding, infection, damage to adjacent structures or low yield requiring additional tests.  All of the questions were answered and there is agreement to proceed.  Consent signed and in chart.  Thank you for this interesting consult.  I greatly enjoyed meeting IDANIA DESOUZA and look forward to participating in their care.  A copy of this report was sent to the requesting provider on this date.  Electronically Signed: Paula Libra Evanny Ellerbe, MD 12/23/2020, 8:33 AM   I spent a total of  15 Minutes  in face to face in clinical consultation, greater than 50% of which was counseling/coordinating care for Bone marrow biopsy and aspiration.

## 2020-12-23 NOTE — Procedures (Signed)
Interventional Radiology Procedure Note ? ?Indication: Leukopenia  ? ?Procedure: CT guided aspirate and core biopsy of right iliac bone ? ?Complications: None ? ?Bleeding: Minimal ? ?Lexa Coronado, MD ?[phone] ? ? ? ?

## 2020-12-30 ENCOUNTER — Other Ambulatory Visit: Payer: Self-pay

## 2021-01-02 LAB — SURGICAL PATHOLOGY

## 2021-01-05 ENCOUNTER — Encounter (HOSPITAL_COMMUNITY): Payer: Self-pay | Admitting: Oncology

## 2021-01-12 ENCOUNTER — Other Ambulatory Visit: Payer: Self-pay

## 2021-01-12 ENCOUNTER — Inpatient Hospital Stay: Payer: Medicare Other | Attending: Oncology | Admitting: Oncology

## 2021-01-12 ENCOUNTER — Inpatient Hospital Stay: Payer: Medicare Other

## 2021-01-12 VITALS — BP 140/101 | HR 68 | Temp 98.2°F | Resp 18

## 2021-01-12 DIAGNOSIS — D709 Neutropenia, unspecified: Secondary | ICD-10-CM

## 2021-01-12 DIAGNOSIS — D708 Other neutropenia: Secondary | ICD-10-CM | POA: Insufficient documentation

## 2021-01-12 DIAGNOSIS — D72819 Decreased white blood cell count, unspecified: Secondary | ICD-10-CM

## 2021-01-12 DIAGNOSIS — E039 Hypothyroidism, unspecified: Secondary | ICD-10-CM

## 2021-01-12 LAB — CBC WITH DIFFERENTIAL/PLATELET
Abs Immature Granulocytes: 0 10*3/uL (ref 0.00–0.07)
Basophils Absolute: 0 10*3/uL (ref 0.0–0.1)
Basophils Relative: 2 %
Eosinophils Absolute: 0.1 10*3/uL (ref 0.0–0.5)
Eosinophils Relative: 6 %
HCT: 37.5 % (ref 36.0–46.0)
Hemoglobin: 11.9 g/dL — ABNORMAL LOW (ref 12.0–15.0)
Immature Granulocytes: 0 %
Lymphocytes Relative: 59 %
Lymphs Abs: 0.6 10*3/uL — ABNORMAL LOW (ref 0.7–4.0)
MCH: 27.5 pg (ref 26.0–34.0)
MCHC: 31.7 g/dL (ref 30.0–36.0)
MCV: 86.8 fL (ref 80.0–100.0)
Monocytes Absolute: 0.2 10*3/uL (ref 0.1–1.0)
Monocytes Relative: 23 %
Neutro Abs: 0.1 10*3/uL — CL (ref 1.7–7.7)
Neutrophils Relative %: 10 %
Platelets: 147 10*3/uL — ABNORMAL LOW (ref 150–400)
RBC: 4.32 MIL/uL (ref 3.87–5.11)
RDW: 14.7 % (ref 11.5–15.5)
WBC: 1 10*3/uL — CL (ref 4.0–10.5)
nRBC: 0 % (ref 0.0–0.2)

## 2021-01-12 MED ORDER — CYANOCOBALAMIN 1000 MCG/ML IJ SOLN: 1000.0000 ug | Freq: Once | INTRAMUSCULAR | Status: DC

## 2021-01-12 MED ORDER — CYANOCOBALAMIN 1000 MCG/ML IJ SOLN
1000.0000 ug | Freq: Once | INTRAMUSCULAR | Status: AC
  Administered 2021-01-12: 1000 ug via INTRAMUSCULAR
  Filled 2021-01-12: qty 1

## 2021-01-12 NOTE — Progress Notes (Signed)
Pt here for results from bone marrow biopsy.

## 2021-01-12 NOTE — Progress Notes (Signed)
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  Patient was noted to have a white cell count of 0.6 with an [place] of 100 when was checked by her PCP in September 2022.  She had repeat labs doneOn 12/12/2020 which again showed white cell count of 0.6 with an ANC of 100, H&H of 11.5/35.6 and a platelet count of 136.  B12 levels were low at 174.  Folate copper normal.  ANA comprehensive panel unremarkable other than mildly elevated ribonucleoprotein of 1.2.  TSH was low at 0.16.  Patient underwent a bone marrow biopsy given the severe neutropenia which showed hypercellular bone marrow with trilineage hematopoiesis.  Polytypic plasmacytosis and lymphocytosis.  Leukocytes morphologically unremarkable peripheral smear.  Bone marrow aspirate shows orderly maturation without any overt dysplasia in the granulocytic precursors.  Mildly increased monocytes.  Significant large granular lymphocytic population not identified in the peripheral blood smear.  Overall findings were reported to be nonspecific.  Increased CD8 T-cell population without CD56 expression.  Favored to be reactive secondary to infection/autoimmune.  T cells rearrangement studies pending.  Interval history-patient is here with her son today and reports doing well overall and feels at her baseline state of health.  She has not had any interim infections or hospitalizations.  She  repeatedly desires to leave the clinic as soon as possible and does not wish to have any intervention done.  ECOG PS- 1 Pain scale- 0   Review of systems- Review of Systems  Constitutional:  Negative for chills, fever, malaise/fatigue and weight loss.  HENT:  Negative for congestion, ear discharge and nosebleeds.   Eyes:  Negative for blurred vision.  Respiratory:  Negative for cough, hemoptysis, sputum production, shortness of breath and wheezing.   Cardiovascular:  Negative for chest pain, palpitations, orthopnea and claudication.  Gastrointestinal:  Negative for abdominal pain, blood in stool, constipation, diarrhea, heartburn, melena, nausea and vomiting.  Genitourinary:  Negative for dysuria, flank pain, frequency, hematuria and urgency.  Musculoskeletal:  Negative for back pain, joint pain and myalgias.  Skin:  Negative for rash.  Neurological:  Negative for dizziness, tingling, focal weakness, seizures, weakness and headaches.  Endo/Heme/Allergies:  Does not bruise/bleed easily.  Psychiatric/Behavioral:  Negative for depression and suicidal ideas. The patient does not have insomnia.      No Known Allergies   Past Medical History:  Diagnosis Date   Thyroid disease      Past Surgical History:  Procedure Laterality Date   ABDOMINAL HYSTERECTOMY     FOOT SURGERY      Social History   Socioeconomic History   Marital status: Widowed    Spouse name: Not on file   Number of children: Not on file   Years of education: Not on file   Highest education level: Not on file  Occupational History   Not on file  Tobacco Use   Smoking status: Every Day   Smokeless tobacco: Never  Vaping Use   Vaping Use: Never used  Substance and Sexual Activity  Alcohol use: Not Currently   Drug use: No   Sexual activity: Not on file  Other Topics Concern   Not on file  Social History Narrative   Not on file   Social Determinants of Health   Financial Resource Strain: Not on file   Food Insecurity: Not on file  Transportation Needs: Not on file  Physical Activity: Not on file  Stress: Not on file  Social Connections: Not on file  Intimate Partner Violence: Not on file    No family history on file.   Current Outpatient Medications:    aspirin 81 MG EC tablet, Take by mouth. (Patient not taking: Reported on 12/08/2020), Disp: , Rfl:    diphenhydrAMINE-zinc acetate (BENADRYL EXTRA STRENGTH) cream, Apply 1 application topically 3 (three) times daily as needed for itching., Disp: 28.4 g, Rfl: 0   famotidine (PEPCID) 20 MG tablet, Take 1 tablet (20 mg total) by mouth daily for 3 days., Disp: 60 tablet, Rfl: 0   levothyroxine (SYNTHROID) 100 MCG tablet, Take 100 mcg by mouth daily before breakfast., Disp: , Rfl:    meloxicam (MOBIC) 15 MG tablet, Mobic 15 mg tablet  Take 1 tablet every day by oral route., Disp: , Rfl:    polyethylene glycol (MIRALAX / GLYCOLAX) 17 g packet, Take 17 g by mouth daily. (Patient not taking: Reported on 12/08/2020), Disp: 14 each, Rfl: 0   triamcinolone cream (KENALOG) 0.1 %, Apply topically 2 (two) times daily. (Patient not taking: Reported on 12/08/2020), Disp: , Rfl:   Physical exam:  Vitals:   01/12/21 1304  BP: (!) 140/101  Pulse: 68  Resp: 18  Temp: 98.2 F (36.8 C)  TempSrc: Oral  SpO2: 100%   Physical Exam Constitutional:      General: She is not in acute distress. HENT:     Mouth/Throat:     Mouth: Mucous membranes are moist.     Pharynx: Oropharynx is clear.  Cardiovascular:     Rate and Rhythm: Normal rate and regular rhythm.     Heart sounds: Normal heart sounds.  Pulmonary:     Effort: Pulmonary effort is normal.     Breath sounds: Normal breath sounds.  Abdominal:     General: Bowel sounds are normal.     Palpations: Abdomen is soft.  Skin:    General: Skin is warm and dry.  Neurological:     Mental Status: She is alert and oriented to person, place, and time.     CMP Latest Ref Rng & Units 03/30/2019   Glucose 70 - 99 mg/dL 126(H)  BUN 8 - 23 mg/dL 19  Creatinine 0.44 - 1.00 mg/dL 0.71  Sodium 135 - 145 mmol/L 137  Potassium 3.5 - 5.1 mmol/L 4.0  Chloride 98 - 111 mmol/L 105  CO2 22 - 32 mmol/L 22  Calcium 8.9 - 10.3 mg/dL 8.9  Total Protein 6.5 - 8.1 g/dL 6.8  Total Bilirubin 0.3 - 1.2 mg/dL 0.7  Alkaline Phos 38 - 126 U/L 59  AST 15 - 41 U/L 18  ALT 0 - 44 U/L 12   CBC Latest Ref Rng & Units 01/12/2021  WBC 4.0 - 10.5 K/uL 1.0(LL)  Hemoglobin 12.0 - 15.0 g/dL 11.9(L)  Hematocrit 36.0 - 46.0 % 37.5  Platelets 150 - 400 K/uL 147(L)    No images are attached to the encounter.  CT BONE MARROW BIOPSY & ASPIRATION  Result Date: 12/23/2020 INDICATION: Leukopenia EXAM: CT GUIDED BONE MARROW ASPIRATES AND BIOPSY MEDICATIONS: None. ANESTHESIA/SEDATION:  Fentanyl 75 mcg IV; Versed 1.5 mg IV Moderate Sedation Time:  20 minutes The patient was continuously monitored during the procedure by the interventional radiology nurse under my direct supervision. COMPLICATIONS: None immediate. PROCEDURE: The procedure was explained to the patient. The risks and benefits of the procedure were discussed and the patient's questions were addressed. Informed consent was obtained from the patient. The patient was placed prone on CT table. Images of the pelvis were obtained. The right side of back was prepped and draped in sterile fashion. The skin and right posterior ilium were anesthetized with 1% lidocaine. 11 gauge bone needle was directed into the right ilium with CT guidance. Two aspirates and 2 core biopsy were obtained. Bandage placed over the puncture site. IMPRESSION: CT guided bone marrow aspiration and core biopsy. Electronically Signed   By: Miachel Roux M.D.   On: 12/23/2020 10:27     Assessment and plan- Patient is a 80 y.o. female with severe neutropenia here to discuss bone marrow results and further management  Patient continues to have severe neutropenia of unclear etiology.  Her ANC did  increase from 100-200 on the day she had bone marrow biopsy.  Her white count is further improved today at 1 but ANC remains at 100.  Discussed the results of bone marrow biopsy with the patient and her son which did not show any evidence of lymphoma or leukemia.  There was orderly maturation of granulocytic precursors noted.  Definitive conclusion could not be drawn from bone marrow biopsy which was reported to be either infection or autoimmune is a positive for neutropenia.  Since there is no primary bone marrow process identified in her neutropenia we are really need to treat this like autoimmune neutropenia with either high-dose steroids with or without Rituxan.  However patient is extremely hesitant to pursue any sort of intervention at this time.  Thankfully patient has not had any infectious complications despite severe neutropenia over the last 2 months.  Her B12 levels were low at 170 and we will proceed with 1 dose of B12 injection today.  She is hesitant to take any further B12 injections.  She is willing to try oral B12.  It would be unusual for low B12 to cause severe neutropenia but given that patient does not desire any other intervention at this time I will continue to monitor her neutropenia conservatively with repeat checks every month and see her back in 3 months with repeat B12 levels.  Patient is also on 100 mcg of levothyroxine and her TSH level was low potentially indicating hyperthyroidism.  I am not making any changes to her thyroid regiment and I have explained to the patient's son that he needs to speak to Dr. Hall Busing to see if her TSH and free T4 can be repeated again in 4 to 6 weeks.   Visit Diagnosis 1. 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 Randa Evens, MD, MPH [facility] at [facility] [phone] 01/12/2021 4:02 PM

## 2021-01-26 LAB — SURGICAL PATHOLOGY

## 2021-02-12 ENCOUNTER — Other Ambulatory Visit: Payer: Self-pay

## 2021-02-12 ENCOUNTER — Inpatient Hospital Stay: Payer: Medicare Other | Attending: Oncology

## 2021-02-12 DIAGNOSIS — D709 Neutropenia, unspecified: Secondary | ICD-10-CM | POA: Diagnosis not present

## 2021-02-12 DIAGNOSIS — D72819 Decreased white blood cell count, unspecified: Secondary | ICD-10-CM

## 2021-02-12 LAB — CBC WITH DIFFERENTIAL/PLATELET
Abs Immature Granulocytes: 0 10*3/uL (ref 0.00–0.07)
Basophils Absolute: 0 10*3/uL (ref 0.0–0.1)
Basophils Relative: 2 %
Eosinophils Absolute: 0 10*3/uL (ref 0.0–0.5)
Eosinophils Relative: 3 %
HCT: 36.9 % (ref 36.0–46.0)
Hemoglobin: 11.7 g/dL — ABNORMAL LOW (ref 12.0–15.0)
Immature Granulocytes: 0 %
Lymphocytes Relative: 52 %
Lymphs Abs: 0.6 10*3/uL — ABNORMAL LOW (ref 0.7–4.0)
MCH: 27.9 pg (ref 26.0–34.0)
MCHC: 31.7 g/dL (ref 30.0–36.0)
MCV: 87.9 fL (ref 80.0–100.0)
Monocytes Absolute: 0.2 10*3/uL (ref 0.1–1.0)
Monocytes Relative: 18 %
Neutro Abs: 0.3 10*3/uL — CL (ref 1.7–7.7)
Neutrophils Relative %: 25 %
Platelets: 185 10*3/uL (ref 150–400)
RBC: 4.2 MIL/uL (ref 3.87–5.11)
RDW: 14.6 % (ref 11.5–15.5)
WBC: 1.1 10*3/uL — CL (ref 4.0–10.5)
nRBC: 0 % (ref 0.0–0.2)

## 2021-03-10 ENCOUNTER — Other Ambulatory Visit: Payer: Self-pay | Admitting: *Deleted

## 2021-03-10 DIAGNOSIS — D72819 Decreased white blood cell count, unspecified: Secondary | ICD-10-CM

## 2021-03-16 ENCOUNTER — Other Ambulatory Visit: Payer: Self-pay

## 2021-03-16 ENCOUNTER — Inpatient Hospital Stay: Payer: Medicare Other | Attending: Oncology

## 2021-03-16 DIAGNOSIS — D72819 Decreased white blood cell count, unspecified: Secondary | ICD-10-CM

## 2021-03-16 DIAGNOSIS — D708 Other neutropenia: Secondary | ICD-10-CM | POA: Diagnosis not present

## 2021-03-16 LAB — CBC WITH DIFFERENTIAL/PLATELET
Abs Immature Granulocytes: 0 10*3/uL (ref 0.00–0.07)
Basophils Absolute: 0 10*3/uL (ref 0.0–0.1)
Basophils Relative: 2 %
Eosinophils Absolute: 0 10*3/uL (ref 0.0–0.5)
Eosinophils Relative: 4 %
HCT: 36.5 % (ref 36.0–46.0)
Hemoglobin: 11.7 g/dL — ABNORMAL LOW (ref 12.0–15.0)
Immature Granulocytes: 0 %
Lymphocytes Relative: 61 %
Lymphs Abs: 0.6 10*3/uL — ABNORMAL LOW (ref 0.7–4.0)
MCH: 27.8 pg (ref 26.0–34.0)
MCHC: 32.1 g/dL (ref 30.0–36.0)
MCV: 86.7 fL (ref 80.0–100.0)
Monocytes Absolute: 0.2 10*3/uL (ref 0.1–1.0)
Monocytes Relative: 20 %
Neutro Abs: 0.1 10*3/uL — CL (ref 1.7–7.7)
Neutrophils Relative %: 13 %
Platelets: 159 10*3/uL (ref 150–400)
RBC: 4.21 MIL/uL (ref 3.87–5.11)
RDW: 14.5 % (ref 11.5–15.5)
Smear Review: NORMAL
WBC: 1 10*3/uL — CL (ref 4.0–10.5)
nRBC: 0 % (ref 0.0–0.2)

## 2021-04-09 ENCOUNTER — Encounter (HOSPITAL_COMMUNITY): Payer: Self-pay

## 2021-04-21 ENCOUNTER — Encounter: Payer: Self-pay | Admitting: Nurse Practitioner

## 2021-04-21 ENCOUNTER — Inpatient Hospital Stay (HOSPITAL_BASED_OUTPATIENT_CLINIC_OR_DEPARTMENT_OTHER): Payer: Medicare Other | Admitting: Nurse Practitioner

## 2021-04-21 ENCOUNTER — Other Ambulatory Visit: Payer: Self-pay

## 2021-04-21 ENCOUNTER — Inpatient Hospital Stay: Payer: Medicare Other | Attending: Oncology

## 2021-04-21 VITALS — BP 133/51 | HR 61 | Temp 97.8°F | Resp 20 | Wt 102.1 lb

## 2021-04-21 DIAGNOSIS — D709 Neutropenia, unspecified: Secondary | ICD-10-CM | POA: Insufficient documentation

## 2021-04-21 DIAGNOSIS — E538 Deficiency of other specified B group vitamins: Secondary | ICD-10-CM | POA: Insufficient documentation

## 2021-04-21 DIAGNOSIS — D72819 Decreased white blood cell count, unspecified: Secondary | ICD-10-CM

## 2021-04-21 LAB — CBC WITH DIFFERENTIAL/PLATELET
Abs Immature Granulocytes: 0 10*3/uL (ref 0.00–0.07)
Basophils Absolute: 0 10*3/uL (ref 0.0–0.1)
Basophils Relative: 2 %
Eosinophils Absolute: 0 10*3/uL (ref 0.0–0.5)
Eosinophils Relative: 2 %
HCT: 38.2 % (ref 36.0–46.0)
Hemoglobin: 12.4 g/dL (ref 12.0–15.0)
Immature Granulocytes: 0 %
Lymphocytes Relative: 58 %
Lymphs Abs: 0.5 10*3/uL — ABNORMAL LOW (ref 0.7–4.0)
MCH: 28 pg (ref 26.0–34.0)
MCHC: 32.5 g/dL (ref 30.0–36.0)
MCV: 86.2 fL (ref 80.0–100.0)
Monocytes Absolute: 0.2 10*3/uL (ref 0.1–1.0)
Monocytes Relative: 23 %
Neutro Abs: 0.1 10*3/uL — CL (ref 1.7–7.7)
Neutrophils Relative %: 15 %
Platelets: 155 10*3/uL (ref 150–400)
RBC: 4.43 MIL/uL (ref 3.87–5.11)
RDW: 14.3 % (ref 11.5–15.5)
WBC: 0.9 10*3/uL — CL (ref 4.0–10.5)
nRBC: 0 % (ref 0.0–0.2)

## 2021-04-21 NOTE — Progress Notes (Signed)
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  Patient was noted to have a white cell count of 0.6 with an [place] of 100 when was checked by her PCP in September 2022.  She had repeat labs doneOn 12/12/2020 which again showed white cell count of 0.6 with an ANC of 100, H&H of 11.5/35.6 and a platelet count of 136.  B12 levels were low at 174.  Folate copper normal.  ANA comprehensive panel unremarkable other than mildly elevated ribonucleoprotein of 1.2.  TSH was low at 0.16.  Patient underwent a bone marrow biopsy given the severe neutropenia which showed hypercellular bone marrow with trilineage hematopoiesis.  Polytypic plasmacytosis and lymphocytosis.  Leukocytes morphologically unremarkable peripheral smear.  Bone marrow aspirate shows orderly maturation without any overt dysplasia in the granulocytic precursors.  Mildly increased monocytes.  Significant large granular lymphocytic population not identified in the peripheral blood smear.  Overall findings were reported to be nonspecific.  Increased CD8 T-cell population without CD56 expression.  Favored to be reactive secondary to infection/autoimmune.  T cells rearrangement studies pending.  Interval history-patient is 81 year old female with above history of severe neutropenia who presents to clinic for follow-up and discussion of lab results.  She reports feeling well.  Continues to walk multiple times a day for exercise.  Denies  recent fevers or infections.  Has not required antibiotics.  Denies complaints today except that she does not wish to follow-up at the cancer center in the future as she feels fine.  She repeatedly expresses her desire to leave the clinic as soon as possible and does not wish to have any interventions done now or in the future.  She has not been taking B12 due to preference.  ECOG PS- 0 Pain scale- 0  Review of systems- Review of Systems  Constitutional:  Negative for chills, fever, malaise/fatigue and weight loss.  HENT:  Negative for congestion, ear discharge and nosebleeds.   Eyes:  Negative for blurred vision.  Respiratory:  Negative for cough, hemoptysis, sputum production, shortness of breath and wheezing.   Cardiovascular:  Negative for chest pain, palpitations, orthopnea and claudication.  Gastrointestinal:  Negative for abdominal pain, blood in stool, constipation, diarrhea, heartburn, melena, nausea and vomiting.  Genitourinary:  Negative for dysuria, flank pain, frequency, hematuria and urgency.  Musculoskeletal:  Negative for back pain, joint pain and myalgias.  Skin:  Negative for rash.  Neurological:  Negative for dizziness, tingling, focal weakness, seizures, weakness and headaches.  Endo/Heme/Allergies:  Does not bruise/bleed easily.  Psychiatric/Behavioral:  Negative for depression and suicidal ideas. The patient does not have insomnia.      No Known Allergies   Past Medical History:  Diagnosis Date   Thyroid disease      Past Surgical History:  Procedure Laterality Date   ABDOMINAL HYSTERECTOMY     FOOT SURGERY      Social History   Socioeconomic History   Marital status: Widowed    Spouse name: Not on file   Number of children: Not on file   Years  of education: Not on file   Highest education level: Not on file  Occupational History   Not on file  Tobacco Use   Smoking status: Every Day   Smokeless tobacco: Never  Vaping Use   Vaping Use: Never used   Substance and Sexual Activity   Alcohol use: Not Currently   Drug use: No   Sexual activity: Not on file  Other Topics Concern   Not on file  Social History Narrative   Not on file   Social Determinants of Health   Financial Resource Strain: Not on file  Food Insecurity: Not on file  Transportation Needs: Not on file  Physical Activity: Not on file  Stress: Not on file  Social Connections: Not on file  Intimate Partner Violence: Not on file    History reviewed. No pertinent family history.   Current Outpatient Medications:    diphenhydrAMINE-zinc acetate (BENADRYL EXTRA STRENGTH) cream, Apply 1 application topically 3 (three) times daily as needed for itching., Disp: 28.4 g, Rfl: 0   famotidine (PEPCID) 20 MG tablet, Take 1 tablet (20 mg total) by mouth daily for 3 days., Disp: 60 tablet, Rfl: 0   levothyroxine (SYNTHROID) 100 MCG tablet, Take 100 mcg by mouth daily before breakfast., Disp: , Rfl:    meloxicam (MOBIC) 15 MG tablet, Mobic 15 mg tablet  Take 1 tablet every day by oral route., Disp: , Rfl:    aspirin 81 MG EC tablet, Take by mouth. (Patient not taking: Reported on 12/08/2020), Disp: , Rfl:    polyethylene glycol (MIRALAX / GLYCOLAX) 17 g packet, Take 17 g by mouth daily. (Patient not taking: Reported on 12/08/2020), Disp: 14 each, Rfl: 0   triamcinolone cream (KENALOG) 0.1 %, Apply topically 2 (two) times daily. (Patient not taking: Reported on 12/08/2020), Disp: , Rfl:   Physical exam:  Vitals:   04/21/21 1401  BP: (!) 133/51  Pulse: 61  Resp: 20  Temp: 97.8 F (36.6 C)  SpO2: 100%  Weight: 102 lb 1.6 oz (46.3 kg)   Physical Exam Constitutional:      Appearance: She is not ill-appearing.     Comments: Accompanied by son  HENT:     Ears:     Comments: Hard of hearing Musculoskeletal:        General: No deformity.     Comments: Ambulates w/o aids  Skin:    Coloration: Skin is not pale.  Neurological:     Mental Status: She is alert.  Psychiatric:         Behavior: Behavior is agitated.        Judgment: Judgment is impulsive.     CMP Latest Ref Rng & Units 03/30/2019  Glucose 70 - 99 mg/dL 126(H)  BUN 8 - 23 mg/dL 19  Creatinine 0.44 - 1.00 mg/dL 0.71  Sodium 135 - 145 mmol/L 137  Potassium 3.5 - 5.1 mmol/L 4.0  Chloride 98 - 111 mmol/L 105  CO2 22 - 32 mmol/L 22  Calcium 8.9 - 10.3 mg/dL 8.9  Total Protein 6.5 - 8.1 g/dL 6.8  Total Bilirubin 0.3 - 1.2 mg/dL 0.7  Alkaline Phos 38 - 126 U/L 59  AST 15 - 41 U/L 18  ALT 0 - 44 U/L 12   CBC Latest Ref Rng & Units 04/21/2021  WBC 4.0 - 10.5 K/uL 0.9(LL)  Hemoglobin 12.0 - 15.0 g/dL 12.4  Hematocrit 36.0 - 46.0 % 38.2  Platelets 150 - 400 K/uL 155    No images are  attached to the encounter.  No results found.   Assessment and plan- Patient is a 81 y.o. female   Severe neutropenia- unclear etiology. S/p bone marrow biopsy without evidence of lymphoma or leukemia. Orderly maturation of granulocytic precursors were noted which could be related to either infection or autoimmune in setting of neutropenia though not definitive. Recommendation was to treat this as an autoimmune neutropenia with either high dose steroids with or without rituxan. Patient has not had any infectious complications and remains asymptomatic. Her [place] today is stable at 0.1She expresses desire to not pursue any additional treatment or follow up.  B12 deficiency- B12 was low at 170. She declines to take any oral b12 or injections.   Patient declines future evaluation at the cancer center and requests to have blood work followed by pcp or not at all. She agrees to return to the cancer center in the future as needed.   Visit Diagnosis 1. Severe neutropenia (HCC)   2. B12 deficiency    Beckey Rutter, DNP, AGNP-C [place] at [facility] [phone] (clinic) 04/21/2021  CC: Dr. Hall Busing, Dr. Janese Banks

## 2021-12-30 ENCOUNTER — Ambulatory Visit: Payer: Medicare Other | Admitting: Oncology

## 2022-01-22 ENCOUNTER — Inpatient Hospital Stay: Payer: Medicare Other | Attending: Oncology | Admitting: Oncology

## 2022-01-22 ENCOUNTER — Encounter: Payer: Self-pay | Admitting: Oncology

## 2022-01-22 VITALS — BP 107/58 | HR 76 | Resp 16 | Wt 90.2 lb

## 2022-01-22 DIAGNOSIS — D709 Neutropenia, unspecified: Secondary | ICD-10-CM

## 2022-01-22 NOTE — Progress Notes (Signed)
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  Patient was noted to have a white cell count of 0.6 with an [place] of 100 when was checked by her PCP in September 2022.  She had repeat labs doneOn 12/12/2020 which again showed white cell count of 0.6 with an ANC of 100, H&H of 11.5/35.6 and a platelet count of 136.  B12 levels were low at 174.  Folate copper normal.  ANA comprehensive panel unremarkable other than mildly elevated ribonucleoprotein of 1.2.  TSH was low at 0.16.  Patient underwent a bone marrow biopsy given the severe neutropenia which showed hypercellular bone marrow with trilineage hematopoiesis.  Polytypic plasmacytosis and lymphocytosis.  Leukocytes morphologically unremarkable peripheral smear.  Bone marrow aspirate shows orderly maturation without any overt dysplasia in the granulocytic precursors.  Mildly increased monocytes.  Significant large granular lymphocytic population not identified in the peripheral blood smear.  Overall findings were reported to be nonspecific.  Increased CD8 T-cell population without CD56 expression.  Favored to be reactive secondary to infection/autoimmune.  T cells 1T study showed multiple clonal spikes with TCR B -A TCRB- B TCRG.  This can be seen with viral infections although T-cell malignancy is also a possibility.  Interval history-patient last seen in our practice in January 2023 and was then lost  to follow-up.  She is doing well for her age and has not had any recurrent infections or hospitalizations.  Appetite and weight have remained stable.  She lives with her son and states that she is overall active  ECOG PS- 1 Pain scale- 0   Review of systems- Review of Systems  Constitutional:  Negative for chills, fever, malaise/fatigue and weight loss.  HENT:  Negative for congestion, ear discharge and nosebleeds.   Eyes:  Negative for blurred vision.  Respiratory:  Negative for cough, hemoptysis, sputum production, shortness of breath and wheezing.   Cardiovascular:  Negative for chest pain, palpitations, orthopnea and claudication.  Gastrointestinal:  Negative for abdominal pain, blood in stool, constipation, diarrhea, heartburn, melena, nausea and vomiting.  Genitourinary:  Negative for dysuria, flank pain, frequency, hematuria and urgency.  Musculoskeletal:  Negative for back pain, joint pain and myalgias.  Skin:  Negative for rash.  Neurological:  Negative for dizziness, tingling, focal weakness, seizures, weakness and headaches.  Endo/Heme/Allergies:  Does not bruise/bleed easily.  Psychiatric/Behavioral:  Negative for depression and suicidal ideas. The patient does not have insomnia.       No Known Allergies   Past Medical History:  Diagnosis Date   Thyroid disease      Past Surgical History:  Procedure Laterality Date   ABDOMINAL HYSTERECTOMY     FOOT SURGERY      Social History   Socioeconomic History   Marital status: Widowed    Spouse name: Not on file   Number of children: Not on file   Years of education: Not on file   Highest education level: Not on file  Occupational History   Not on file  Tobacco Use  Smoking status: Every Day   Smokeless tobacco: Never  Vaping Use   Vaping Use: Never used  Substance and Sexual Activity   Alcohol use: Not Currently   Drug use: No   Sexual activity: Not on file  Other Topics Concern   Not on file  Social  History Narrative   Not on file   Social Determinants of Health   Financial Resource Strain: Not on file  Food Insecurity: Not on file  Transportation Needs: Not on file  Physical Activity: Not on file  Stress: Not on file  Social Connections: Not on file  Intimate Partner Violence: Not on file    History reviewed. No pertinent family history.   Current Outpatient Medications:    levothyroxine (SYNTHROID) 100 MCG tablet, Take 100 mcg by mouth daily before breakfast., Disp: , Rfl:    aspirin 81 MG EC tablet, Take by mouth. (Patient not taking: Reported on 12/08/2020), Disp: , Rfl:    diphenhydrAMINE-zinc acetate (BENADRYL EXTRA STRENGTH) cream, Apply 1 application topically 3 (three) times daily as needed for itching. (Patient not taking: Reported on 01/22/2022), Disp: 28.4 g, Rfl: 0   famotidine (PEPCID) 20 MG tablet, Take 1 tablet (20 mg total) by mouth daily for 3 days. (Patient not taking: Reported on 01/22/2022), Disp: 60 tablet, Rfl: 0   meloxicam (MOBIC) 15 MG tablet, Mobic 15 mg tablet  Take 1 tablet every day by oral route. (Patient not taking: Reported on 01/22/2022), Disp: , Rfl:    polyethylene glycol (MIRALAX / GLYCOLAX) 17 g packet, Take 17 g by mouth daily. (Patient not taking: Reported on 12/08/2020), Disp: 14 each, Rfl: 0   triamcinolone cream (KENALOG) 0.1 %, Apply topically 2 (two) times daily. (Patient not taking: Reported on 12/08/2020), Disp: , Rfl:   Physical exam:  Vitals:   01/22/22 1034  BP: (!) 107/58  Pulse: 76  Resp: 16  SpO2: 100%  Weight: 90 lb 3.2 oz (40.9 kg)   Physical Exam Constitutional:      General: She is not in acute distress. Cardiovascular:     Rate and Rhythm: Normal rate and regular rhythm.     Heart sounds: Normal heart sounds.  Pulmonary:     Effort: Pulmonary effort is normal.     Breath sounds: Normal breath sounds.  Abdominal:     General: Bowel sounds are normal.     Palpations: Abdomen is soft.  Lymphadenopathy:      Comments: No palpable cervical, supraclavicular, axillary or inguinal adenopathy    Skin:    General: Skin is warm and dry.  Neurological:     Mental Status: She is alert and oriented to person, place, and time.         Latest Ref Rng & Units 03/30/2019   10:41 AM  CMP  Glucose 70 - 99 mg/dL 126   BUN 8 - 23 mg/dL 19   Creatinine 0.44 - 1.00 mg/dL 0.71   Sodium 135 - 145 mmol/L 137   Potassium 3.5 - 5.1 mmol/L 4.0   Chloride 98 - 111 mmol/L 105   CO2 22 - 32 mmol/L 22   Calcium 8.9 - 10.3 mg/dL 8.9   Total Protein 6.5 - 8.1 g/dL 6.8   Total Bilirubin 0.3 - 1.2 mg/dL 0.7   Alkaline Phos 38 - 126 U/L 59   AST 15 - 41 U/L 18   ALT 0 - 44 U/L 12       Latest Ref Rng & Units 04/21/2021  1:35 PM  CBC  WBC 4.0 - 10.5 K/uL 0.9   Hemoglobin 12.0 - 15.0 g/dL 12.4   Hematocrit 36.0 - 46.0 % 38.2   Platelets 150 - 400 K/uL 155     No images are attached to the encounter.  No results found.   Assessment and plan- Patient is a 81 y.o. female referred for severeNeutropenia  Patient had normal white cell count up until 2021.  She was noted to have a white cell count of 0.6 in September 2022 and since then her white count has fluctuated between 0.6-1 with an [place] between 100-300.  Despite such severe neutropenia patient has remained asymptomatic with no B symptoms and no recurrent infections.  On clinical exam today patient does not have any significant hepatosplenomegaly or lymphadenopathy.  Bone marrow biopsy also did not show any primary bone marrow because of her neutropenia.  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  Having said that T-cell malignancies tend to be aggressive and she would have manifested by now without any treatment in the last 1 year.  Patient does not wish to go through any work-up  at this time.  She is refusing a PET scan.  She also does not wish to proceed with any treatment for her neutropenia if we were to consider Rituxan infusions.  I have discussed all this in great detail with patient's son.  At this point they would like to continue to follow-up with Dr. Hall Busing.  Should she become symptomatic from her neutropenia in the form of recurrent infections or if she were to develop B symptoms or lymphadenopathy there would see me again in the future.   Visit Diagnosis 1. Severe neutropenia (HCC)      Dr. Randa Evens, MD, MPH [facility] [phone] 01/22/2022 2:45 PM

## 2023-03-17 LAB — MOLECULAR PATHOLOGY

## 2023-10-24 ENCOUNTER — Ambulatory Visit (INDEPENDENT_AMBULATORY_CARE_PROVIDER_SITE_OTHER)

## 2023-10-24 ENCOUNTER — Other Ambulatory Visit: Payer: Self-pay

## 2023-10-24 VITALS — BP 132/74 | HR 60 | Ht 63.0 in | Wt 91.5 lb

## 2023-10-24 DIAGNOSIS — E611 Iron deficiency: Secondary | ICD-10-CM | POA: Insufficient documentation

## 2023-10-24 DIAGNOSIS — F03C0 Unspecified dementia, severe, without behavioral disturbance, psychotic disturbance, mood disturbance, and anxiety: Secondary | ICD-10-CM

## 2023-10-24 DIAGNOSIS — G8929 Other chronic pain: Secondary | ICD-10-CM | POA: Insufficient documentation

## 2023-10-24 DIAGNOSIS — F0390 Unspecified dementia without behavioral disturbance: Secondary | ICD-10-CM | POA: Insufficient documentation

## 2023-10-24 DIAGNOSIS — M25551 Pain in right hip: Secondary | ICD-10-CM | POA: Diagnosis not present

## 2023-10-24 DIAGNOSIS — R636 Underweight: Secondary | ICD-10-CM | POA: Insufficient documentation

## 2023-10-24 DIAGNOSIS — Z681 Body mass index (BMI) 19 or less, adult: Secondary | ICD-10-CM

## 2023-10-24 MED ORDER — LEVOTHYROXINE SODIUM 100 MCG PO TABS: 100.0000 ug | ORAL_TABLET | Freq: Every day | ORAL | 3 refills | Status: DC

## 2023-10-24 NOTE — Progress Notes (Signed)
 error

## 2023-10-24 NOTE — Progress Notes (Signed)
 New Patient Visit   Physician: Bernadean Saling A Reniya Mcclees, MD  Patient: [name]   DOB: [birth year]   83 y.o. Female  MRN: [id number] Visit Date: 10/24/2023   Chief Complaint  Patient presents with   Establish Care   Subjective  [name] is a 83 y.o. female who presents today as a new patient to establish care.   HPI  Patient has a history of dementia which is quite severe.  She did have an MRI of the brain in 2015 which showed minor chronic changes.  CT of the head done in 2019 showing mild cortical volume loss.  Patient lives with her son.  She is unable to accomplish all ADLs.  She does need help with medication administration.  She finds that she is showering slightly less.  She is unable to participate in sequential planning.  She does tend to become quite anxious with change but there is no episodes of agitation.  Patient former smoker more than 40 years.  No reported shortness of breath.  Patient is not on any medication.  History of hypothyroidism for which she takes levothyroxine  100 mcg daily.  Patient weight is in the lower ranges with a BMI of 6.21 and a weight of 91 pounds.  This has been stable for some time.  Patient's son reports that the weight loss occurred with episode of severe neutropenia.  There was a bone biopsy done and patient was seen by oncology.  Working dx immune etiology.  Patient refused a PET scan.  Symptoms have been stable and given her overall age and underlying dementia the decision has been made not to pursue with further interventions unless necessary.  Currently we are simply monitoring.  Patient has not had labs in more than a year.  She does receive the flu shot.  She is having some right-sided hip pain towards the groin.  This is particularly present with sitting.  Patient does not want an x-ray or any further intervention in achieving a diagnosis.  She does use aspirin occasionally as needed for pain.  Patient has significant venous  insufficiency in both lower extremities     Assessment & Plan   Problem List Items Addressed This Visit       Nervous and Auditory   Dementia (HCC) - Primary     Other   Hip pain, chronic, right   Underweight (BMI < 18.5)   #1 Dementia.  Patient has significant and quite severe memory loss which is evident just by conversation in the room.  I have offered referral to care coordination for additional resources but it seems that the family is managing for the time being.  They are trying to avoid facility placement is much as possible due to cost and quality concerns.  Goals of care are somewhat limited due to patient's refusal to participate but also given the overall underlying health situation.  Primary goal at this point is much as possible will be to avoid hospitalization and Interventions that would interfere with her quality of life.  2.  Underweight.  Advised multivitamin if possible.  She is at higher risk for iron deficiency and B12 deficiency.  We will continue to monitor her weight.  3.  Right hip pain.  Suspect that this is a primary joint issue but patient cannot do physical therapy does not want an x-ray and generally refuses intervention at this point.  They may try Tylenol  or topical Voltaren.  Plan to check labs including  TSH.  Will follow-up when labs are complete.     Objective  BP 132/74 (BP Location: Left Arm, Patient Position: Sitting, Cuff Size: Normal)   Pulse 60   Ht 5' 3 (1.6 m)   Wt 91 lb 8 oz (41.5 kg)   BMI 16.21 kg/m      Review of Systems  Constitutional:  Negative for chills, fever and weight loss.  Eyes:  Negative for blurred vision. h Respiratory:  Negative for cough and shortness of breath.   Cardiovascular:  Negative for chest pain and palpitations.  Skin:  Negative for rash.  Psychiatric/Behavioral:  Negative for depression. The patient is not nervous/anxious.      Physical Exam Physical Exam Vitals reviewed.  Constitutional:       Appearance: Normal appearance. Well-developed with normal weight.  HENT:     Head: Normocephalic and atraumatic.  Normal mucous membranes, no oral lesions Eyes:     Pupils: Pupils are equal, round, and reactive to light.  Neck:     Thyroid : No thyroid  mass or thyromegaly.  Cardiovascular:     Rate and Rhythm: Normal rate and regular rhythm. Normal heart sounds. Normal peripheral pulses Pulmonary:     Normal breath sounds with normal effort Abdominal:   Abdomen is soft, without tenderness or noted hepatosplenomegaly Musculoskeletal:        General: No swelling or edema  Lymphadenopathy:     Cervical: No cervical adenopathy.  Skin:    General: Skin is warm and dry without noticeable rash. Neurological:     General: No focal deficit present.  Psychiatric:        Mood and Affect: Mood, behavior and cognition normal   Past Medical History:  Diagnosis Date   Thyroid  disease    Past Surgical History:  Procedure Laterality Date   ABDOMINAL HYSTERECTOMY     CATARACT EXTRACTION Bilateral    FOOT SURGERY     Family Status  Relation Name Status   Mother  Deceased   Father  Deceased  No partnership data on file   Family History  Problem Relation Age of Onset   Alzheimer's disease Mother    COPD Father    Heart disease Father    Social History   Socioeconomic History   Marital status: Widowed    Spouse name: Not on file   Number of children: Not on file   Years of education: Not on file   Highest education level: Not on file  Occupational History   Not on file  Tobacco Use   Smoking status: Former    Types: Cigarettes   Smokeless tobacco: Never  Vaping Use   Vaping status: Never Used  Substance and Sexual Activity   Alcohol use: Not Currently   Drug use: No   Sexual activity: Not on file  Other Topics Concern   Not on file  Social History Narrative   Not on file   Social Drivers of Health   Financial Resource Strain: Not on file  Food Insecurity: Not on  file  Transportation Needs: Not on file  Physical Activity: Not on file  Stress: Not on file  Social Connections: Not on file   Outpatient Medications Prior to Visit  Medication Sig   levothyroxine  (SYNTHROID ) 100 MCG tablet Take 100 mcg by mouth daily before breakfast.   [DISCONTINUED] aspirin 81 MG EC tablet Take by mouth. (Patient not taking: Reported on 12/08/2020)   [DISCONTINUED] diphenhydrAMINE -zinc  acetate (BENADRYL  EXTRA STRENGTH) cream Apply 1 application topically 3 (  three) times daily as needed for itching. (Patient not taking: Reported on 01/22/2022)   [DISCONTINUED] famotidine  (PEPCID ) 20 MG tablet Take 1 tablet (20 mg total) by mouth daily for 3 days. (Patient not taking: Reported on 01/22/2022)   [DISCONTINUED] meloxicam (MOBIC) 15 MG tablet Mobic 15 mg tablet  Take 1 tablet every day by oral route. (Patient not taking: Reported on 01/22/2022)   [DISCONTINUED] polyethylene glycol (MIRALAX  / GLYCOLAX ) 17 g packet Take 17 g by mouth daily. (Patient not taking: Reported on 12/08/2020)   [DISCONTINUED] triamcinolone cream (KENALOG) 0.1 % Apply topically 2 (two) times daily. (Patient not taking: Reported on 12/08/2020)   No facility-administered medications prior to visit.   No Known Allergies   There is no immunization history on file for this patient.  Health Maintenance  Topic Date Due   Medicare Annual Wellness (AWV)  Never done   COVID-19 Vaccine (1) Never done   DTaP/Tdap/Td (1 - Tdap) Never done   Pneumococcal Vaccine: 50+ Years (1 of 2 - PCV) Never done   Zoster Vaccines- Shingrix (1 of 2) Never done   DEXA SCAN  Never done   INFLUENZA VACCINE  10/28/2023   Hepatitis B Vaccines  Aged Out   HPV VACCINES  Aged Out   Meningococcal B Vaccine  Aged Out   Hepatitis C Screening  Discontinued    Patient Care Team: Rickie Gutierres A, MD as PCP - General (Family Medicine)  Depression Screen     No data to display           Parris DELENA Juneau, MD  [facility]  Health [facility] [phone] (phone) [phone] (fax)  [facility]

## 2023-11-02 ENCOUNTER — Other Ambulatory Visit

## 2023-11-02 DIAGNOSIS — F03C0 Unspecified dementia, severe, without behavioral disturbance, psychotic disturbance, mood disturbance, and anxiety: Secondary | ICD-10-CM

## 2023-11-02 DIAGNOSIS — E611 Iron deficiency: Secondary | ICD-10-CM

## 2023-11-02 DIAGNOSIS — Z681 Body mass index (BMI) 19 or less, adult: Secondary | ICD-10-CM

## 2023-11-02 DIAGNOSIS — G8929 Other chronic pain: Secondary | ICD-10-CM

## 2023-11-03 ENCOUNTER — Ambulatory Visit: Payer: Self-pay

## 2023-11-03 ENCOUNTER — Telehealth: Payer: Self-pay | Admitting: Physician Assistant

## 2023-11-03 LAB — CBC WITH DIFFERENTIAL/PLATELET
Absolute Lymphocytes: 850 {cells}/uL (ref 850–3900)
Absolute Monocytes: 210 {cells}/uL (ref 200–950)
Basophils Absolute: 20 {cells}/uL (ref 0–200)
Basophils Relative: 1.7 %
Eosinophils Absolute: 20 {cells}/uL (ref 15–500)
Eosinophils Relative: 1.7 %
HCT: 31.4 % — ABNORMAL LOW (ref 35.0–45.0)
Hemoglobin: 10.3 g/dL — ABNORMAL LOW (ref 11.7–15.5)
MCH: 28.5 pg (ref 27.0–33.0)
MCHC: 32.8 g/dL (ref 32.0–36.0)
MCV: 87 fL (ref 80.0–100.0)
MPV: 8.8 fL (ref 7.5–12.5)
Monocytes Relative: 17.5 %
Neutro Abs: 100 {cells}/uL — CL (ref 1500–7800)
Neutrophils Relative %: 8.3 %
Platelets: 245 Thousand/uL (ref 140–400)
RBC: 3.61 Million/uL — ABNORMAL LOW (ref 3.80–5.10)
RDW: 14.7 % (ref 11.0–15.0)
Total Lymphocyte: 70.8 %
WBC: 1.2 Thousand/uL — ABNORMAL LOW (ref 3.8–10.8)

## 2023-11-03 LAB — COMPREHENSIVE METABOLIC PANEL WITH GFR
AG Ratio: 0.9 (calc) — ABNORMAL LOW (ref 1.0–2.5)
ALT: 7 U/L (ref 6–29)
AST: 12 U/L (ref 10–35)
Albumin: 3.6 g/dL (ref 3.6–5.1)
Alkaline phosphatase (APISO): 68 U/L (ref 37–153)
BUN: 16 mg/dL (ref 7–25)
CO2: 25 mmol/L (ref 20–32)
Calcium: 9.5 mg/dL (ref 8.6–10.4)
Chloride: 102 mmol/L (ref 98–110)
Creat: 0.83 mg/dL (ref 0.60–0.95)
Globulin: 4.2 g/dL — ABNORMAL HIGH (ref 1.9–3.7)
Glucose, Bld: 95 mg/dL (ref 65–99)
Potassium: 4.4 mmol/L (ref 3.5–5.3)
Sodium: 138 mmol/L (ref 135–146)
Total Bilirubin: 0.4 mg/dL (ref 0.2–1.2)
Total Protein: 7.8 g/dL (ref 6.1–8.1)
eGFR: 69 mL/min/1.73m2 (ref >=60)

## 2023-11-03 LAB — B12 AND FOLATE PANEL
Folate: 20.2 ng/mL
Vitamin B-12: 457 pg/mL (ref 200–1100)

## 2023-11-03 LAB — IRON,TIBC AND FERRITIN PANEL
%SAT: 16 % (ref 16–45)
Ferritin: 54 ng/mL (ref 16–288)
Iron: 54 ng/mL — AB (ref 45–288)
TIBC: 268 ug/dL (ref 250–450)

## 2023-11-03 LAB — TSH+FREE T4: TSH W/REFLEX TO FT4: 1.89 m[IU]/L (ref 0.40–4.50)

## 2023-11-03 NOTE — Telephone Encounter (Signed)
 As on-call provider, I received a call around midnight last night reporting critical lab result of ANC 100.

## 2023-11-08 ENCOUNTER — Ambulatory Visit (INDEPENDENT_AMBULATORY_CARE_PROVIDER_SITE_OTHER)

## 2023-11-08 VITALS — BP 110/60 | HR 78 | Ht 63.0 in | Wt 90.5 lb

## 2023-11-08 DIAGNOSIS — Z681 Body mass index (BMI) 19 or less, adult: Secondary | ICD-10-CM

## 2023-11-08 DIAGNOSIS — D708 Other neutropenia: Secondary | ICD-10-CM

## 2023-11-08 DIAGNOSIS — R636 Underweight: Secondary | ICD-10-CM

## 2023-11-08 DIAGNOSIS — E039 Hypothyroidism, unspecified: Secondary | ICD-10-CM

## 2023-11-08 DIAGNOSIS — E611 Iron deficiency: Secondary | ICD-10-CM

## 2023-11-08 DIAGNOSIS — D649 Anemia, unspecified: Secondary | ICD-10-CM | POA: Insufficient documentation

## 2023-11-08 DIAGNOSIS — F03C0 Unspecified dementia, severe, without behavioral disturbance, psychotic disturbance, mood disturbance, and anxiety: Secondary | ICD-10-CM

## 2023-11-08 DIAGNOSIS — D709 Neutropenia, unspecified: Secondary | ICD-10-CM | POA: Insufficient documentation

## 2023-11-08 MED ORDER — LEVOTHYROXINE SODIUM 100 MCG PO TABS: 100.0000 ug | ORAL_TABLET | Freq: Every day | ORAL | 3 refills | Status: AC

## 2023-11-08 NOTE — Progress Notes (Signed)
 Progress Note  Physician: Parris DELENA Juneau, MD   HPI: [name] is a 83 y.o. female presenting on 11/08/2023 for Medical Management of Chronic Issues .  Discussed the use of AI scribe software for clinical note transcription with the patient, who gave verbal consent to proceed.  History of Present Illness   [name] is an 83 year old female with significant dementia who presents for follow-up.  Cognitive impairment and behavioral symptoms - Significant dementia with worsening symptoms in the evenings (sundowning) - Increased anxiety and cognitive difficulties during evening hours - No current pharmacologic treatment for dementia - Caregiver maintains a steady routine at home to minimize anxiety  Nutritional intake and weight maintenance - Difficulty maintaining consistent meals due to cognitive impairment - Frequently forgets to eat or feels hungry shortly after meals - Caregiver provides Ensure and protein supplements to support weight maintenance - Encouraged to take multivitamins and consume a variety of fruits and vegetables      Medical history:  Relevant past medical, surgical, family and social history reviewed and updated as indicated. Interim medical history since our last visit reviewed.  Allergies and medications reviewed and updated.   ROS: Negative unless specifically indicated above in HPI.    Current Outpatient Medications:    levothyroxine  (SYNTHROID ) 100 MCG tablet, Take 1 tablet (100 mcg total) by mouth daily before breakfast., Disp: 90 tablet, Rfl: 3       Objective:     BP 110/60 (BP Location: Left Arm, Patient Position: Sitting)   Pulse 78   Ht 5' 3 (1.6 m)   Wt 90 lb 8 oz (41.1 kg)   SpO2 97%   BMI 16.03 kg/m   Wt Readings from Last 3 Encounters:  11/08/23 90 lb 8 oz (41.1 kg)  10/24/23 91 lb 8 oz (41.5 kg)  01/22/22 90 lb 3.2 oz (40.9 kg)    Physical Exam  Physical Exam Vitals reviewed.  Constitutional:       Appearance: Normal appearance. Well-developed with normal weight.  Cardiovascular:     Rate and Rhythm: Normal rate and regular rhythm. Normal heart sounds. Normal peripheral pulses Pulmonary:     Normal breath sounds with normal effort Skin:    General: Skin is warm and dry without noticeable rash. Neurological:     General: No focal deficit present.  Psychiatric:        Mood and Affect: Mood, behavior and cognition normal     Physical Exam   MEASUREMENTS: BMI- 18.5.      Assessment & Plan:   Encounter Diagnoses  Name Primary?   Other neutropenia (HCC) Yes   Underweight (BMI < 18.5)    Iron deficiency    Anemia, unspecified type    Acquired hypothyroidism    Severe dementia, unspecified dementia type, unspecified whether behavioral, psychotic, or mood disturbance or anxiety (HCC)     No orders of the defined types were placed in this encounter.    Assessment and Plan    Dementia with behavioral disturbance (sundowning) Dementia with behavioral disturbance, specifically sundowning, worsens in the evenings with increased anxiety. Non-pharmacological management is in place. - Consider medication for anxiety if symptoms worsen - Explore adult daycare and support services for additional caregiver support - referral to [facility]  Chronic neutropenia Chronic neutropenia with a white blood cell count of 1.2 and neutrophil count of 100. Ongoing issue since 2022 with previous workup including a bone marrow  biopsy. Last seen by oncology in 2023 with a possibility of autoimmune neutropenia. No further interventions at this time given her overall status and care goals  Anemia with mild iron deficiency Anemia with mild iron deficiency with hemoglobin of 10.3, MCV of 87, iron level at 44, and normal ferritin at 54. Possibly related to the same mechanism affecting the white blood cell count. Monitoring without further intervention. - Ensure intake of multivitamins/iron/ensure - Encourage  consumption of fruits and vegetables  Underweight Underweight with a BMI of 18.5. Difficulty maintaining consistent meals due to dementia-related issues, including forgetting to eat. Efforts to increase caloric intake with supplements like Ensure and protein are in place. - Encourage use of nutritional supplements like Ensure - Monitor weight and nutritional intake     Patient encouraged to get flu shot in the fall.  We have decided on a follow-up as needed schedule as patient tends to get a little bit agitated in the office and has difficulty sitting still for the appointment.  Alternatively we can also do phone visits as needed.

## 2023-11-23 ENCOUNTER — Telehealth: Payer: Self-pay

## 2023-11-23 NOTE — Progress Notes (Signed)
 Complex Care Management Note  Care Guide Note 11/23/2023 Name: [name] MRN: [id number] DOB: 05/16/1940  [name] is a 83 y.o. year old female who sees Zafirov, Parris LABOR, MD for primary care. I reached out to [name] by phone today to offer complex care management services.  Ms. Bloch was given information about Complex Care Management services today including:   The Complex Care Management services include support from the care team which includes your Nurse Care Manager, Clinical Social Worker, or Pharmacist.  The Complex Care Management team is here to help remove barriers to the health concerns and goals most important to you. Complex Care Management services are voluntary, and the patient may decline or stop services at any time by request to their care team member.   Complex Care Management Consent Status: Patient agreed to services and verbal consent obtained.   Follow up plan:  Telephone appointment with complex care management team member scheduled for:  12/09/2023  Encounter Outcome:  Patient Scheduled  Jeoffrey Buffalo , RMA     [place]  [facility], [place] [facility] Guide  Direct Dial: [phone]  Website: [email or web address]

## 2023-12-09 ENCOUNTER — Other Ambulatory Visit: Payer: Self-pay | Admitting: *Deleted

## 2023-12-11 NOTE — Patient Instructions (Signed)
 Visit Information  Thank you for taking time to visit with me today. Please don't hesitate to contact me if I can be of further assistance to you.  Following is a copy of your care plan:   Goals Addressed             This Visit's Progress    [facility] Social Work Care Plan       Problems:   Care Coordination needs related to Dementia:   CSW Clinical Goal(s):   Connection to community resources for dementia support Interventions:  Dementia Care:   Current level of care: Home with other family or significant other(s): family member: son  Evaluation of patient safety in current living environment and review of Dementia resources and support   Explored options for Adult Day Programs, private duty and facillity care Confirmed that patient is most comfortable in her home, declines referrals for Adult Day and facility Care-CSW will provide information for review/and or future use Discussed referral to the Guide Program for additional dementia support  Patient Goals/Self-Care Activities:  Patient's son to review resources provided by email  Plan:   No further follow up required: patient's son agreeable to contacting this Child psychotherapist with any additional questions or concerns        Please call 911 if you are experiencing a Mental Health or Behavioral Health Crisis or need someone to talk to.  Patient verbalizes understanding of instructions and care plan provided today and agrees to view in MyChart. Active MyChart status and patient understanding of how to access instructions and care plan via MyChart confirmed with patient.     Vivianne Carles, LCSW [place]  [facility] [place], [facility] Licensed Clinical Social Worker  Direct Dial: [phone]

## 2023-12-11 NOTE — Patient Outreach (Addendum)
 Complex Care Management   Visit Note  12/11/2023  Name:  [name] MRN: [id number] DOB: 1940/10/09  Situation: Referral received for Complex Care Management related to Dementia I obtained verbal consent from Caregiver.  Visit completed with Caregiver  on the phone on 12/09/23  Background:   Past Medical History:  Diagnosis Date   Thyroid  disease     Assessment: Patient's son reports that patient continues to have difficulty completing her ADL's, becomes very irritable/agitated when outside of her own home. Patient's son confirms that patient has resided with him for the last  20 years. Has declined participation in adult day programs due to separation anxiety.  Reports history of in home care aids but was not beneficial at that time. Son agreeable to referral to the [facility] for Dementia support. Patient Reported Symptoms:  Cognitive Cognitive Status: Able to follow simple commands, Confused or disoriented, Poor judgment in daily scenarios Cognitive/Intellectual Conditions Management [RPT]: Other Other: Patient has dementia   Health Maintenance Behaviors: Annual physical exam Healing Pattern: Average Health Facilitated by: Rest  Neurological Neurological Review of Symptoms: No symptoms reported    HEENT HEENT Symptoms Reported: No symptoms reported      Cardiovascular Cardiovascular Symptoms Reported: No symptoms reported    Respiratory Respiratory Symptoms Reported: No symptoms reported    Endocrine Endocrine Symptoms Reported: No symptoms reported    Gastrointestinal Gastrointestinal Symptoms Reported: Incontinence Gastrointestinal Management Strategies: Incontinence garment/pad    Genitourinary Genitourinary Symptoms Reported: No symptoms reported    Integumentary Integumentary Symptoms Reported: Bruising Additional Integumentary Details: Bruises easily, long bruise on her arm- Skin Management Strategies: Routine screening  Musculoskeletal Musculoskelatal  Symptoms Reviewed: Back pain, Joint pain, Unsteady gait Additional Musculoskeletal Details: MD will continue to monitor-son to set up x-reay Musculoskeletal Management Strategies: Routine screening Falls in the past year?: Yes Number of falls in past year: 2 or more Was there an injury with Fall?: Yes Fall Risk Category Calculator: 3 Patient Fall Risk Level: High Fall Risk Patient at Risk for Falls Due to: History of fall(s), Impaired balance/gait, Mental status change Fall risk Follow up: Falls prevention discussed  Psychosocial Psychosocial Symptoms Reported: No symptoms reported     Do you feel physically threatened by others?: No    12/11/2023    PHQ2-9 Depression Screening   Little interest or pleasure in doing things Not at all  Feeling down, depressed, or hopeless Not at all  PHQ-2 - Total Score 0  Trouble falling or staying asleep, or sleeping too much    Feeling tired or having little energy    Poor appetite or overeating     Feeling bad about yourself - or that you are a failure or have let yourself or your family down    Trouble concentrating on things, such as reading the newspaper or watching television    Moving or speaking so slowly that other people could have noticed.  Or the opposite - being so fidgety or restless that you have been moving around a lot more than usual    Thoughts that you would be better off dead, or hurting yourself in some way    PHQ2-9 Total Score    If you checked off any problems, how difficult have these problems made it for you to do your work, take care of things at home, or get along with other people    Depression Interventions/Treatment      There were no vitals filed for this visit.  Medications Reviewed Today  Reviewed by Ermalinda Lenn HERO, LCSW (Social Worker) on 12/11/23 at 1558  Med List Status: <None>   Medication Order Taking? Sig Documenting Provider Last Dose Status Informant  levothyroxine  (SYNTHROID ) 100 MCG tablet  [id number] Yes Take 1 tablet (100 mcg total) by mouth daily before breakfast. Zafirov, Clarissa A, MD  Active             Recommendation:   PCP Follow-up Continue to consider options for Adult Day Program  Referral made to the [facility] through [facility] for Dementia support  Follow Up Plan:   Patient's son denies any additional needs. He is agreeable to the referral to the [facility] for Dementia support. Patient's son agreeable  to contacting  this Child psychotherapist with any additional needs  [employer], [employer] [place]  [facility], [facility] Licensed Clinical Social Worker  Direct Dial: [phone]

## 2023-12-28 ENCOUNTER — Telehealth: Payer: Self-pay | Admitting: *Deleted

## 2023-12-28 NOTE — Patient Outreach (Signed)
 Complex Care Management   Visit Note  12/28/2023  Name:  [name] MRN: [id number] DOB: [birth year]  Situation: Referral received for Complex Care Management related to Dementia I obtained verbal consent from Caregiver.  Visit completed with Caregiver  on the phone  Background:   Past Medical History:  Diagnosis Date   Thyroid  disease     Assessment: Patient Reported Symptoms:  Cognitive Cognitive Status: Able to follow simple commands, Confused or disoriented, Poor judgment in daily scenarios Cognitive/Intellectual Conditions Management [RPT]: Other Other: Patient has dementia   Health Maintenance Behaviors: Annual physical exam Healing Pattern: Average Health Facilitated by: Rest  Neurological Neurological Review of Symptoms: No symptoms reported    HEENT HEENT Symptoms Reported: No symptoms reported      Cardiovascular Cardiovascular Symptoms Reported: No symptoms reported    Respiratory Respiratory Symptoms Reported: No symptoms reported    Endocrine Endocrine Symptoms Reported: No symptoms reported    Gastrointestinal Gastrointestinal Symptoms Reported: Incontinence Gastrointestinal Management Strategies: Incontinence garment/pad    Genitourinary Genitourinary Symptoms Reported: No symptoms reported    Integumentary Integumentary Symptoms Reported: No symptoms reported    Musculoskeletal Musculoskelatal Symptoms Reviewed: Back pain, Unsteady gait Musculoskeletal Management Strategies: Routine screening      Psychosocial Psychosocial Symptoms Reported: No symptoms reported Additional Psychological Details: Patient has been diagnosed wiht dementia, reluctant to participate in Adult Day Programs, however has been referred to the [facility] for additional assessment and dementia support Behavioral Management Strategies: Adequate rest Major Change/Loss/Stressor/Fears (CP): Medical condition, self Techniques to Cope with Loss/Stress/Change: Diversional  activities Quality of Family Relationships: supportive    12/28/2023    PHQ2-9 Depression Screening   Little interest or pleasure in doing things    Feeling down, depressed, or hopeless    PHQ-2 - Total Score    Trouble falling or staying asleep, or sleeping too much    Feeling tired or having little energy    Poor appetite or overeating     Feeling bad about yourself - or that you are a failure or have let yourself or your family down    Trouble concentrating on things, such as reading the newspaper or watching television    Moving or speaking so slowly that other people could have noticed.  Or the opposite - being so fidgety or restless that you have been moving around a lot more than usual    Thoughts that you would be better off dead, or hurting yourself in some way    PHQ2-9 Total Score    If you checked off any problems, how difficult have these problems made it for you to do your work, take care of things at home, or get along with other people    Depression Interventions/Treatment      There were no vitals filed for this visit.  Medications Reviewed Today     Reviewed by Ermalinda Lenn HERO, LCSW (Social Worker) on 12/28/23 at 1322  Med List Status: <None>   Medication Order Taking? Sig Documenting Provider Last Dose Status Informant  levothyroxine  (SYNTHROID ) 100 MCG tablet [id number]  Take 1 tablet (100 mcg total) by mouth daily before breakfast. Zafirov, Clarissa A, MD  Active             Recommendation:   PCP Follow-up Continue follow with the Guide Program for additional Dementia support  Follow Up Plan:   Patient has met all care management goals. Care Management case will be closed. Patient has been provided contact information should new  needs arise.   Makya Yurko, LCSW [place]  [facility], [facility] Licensed Clinical Social Worker  Direct Dial: [phone]

## 2023-12-28 NOTE — Patient Outreach (Signed)
 Phone call to patient's son to confirm that patient is now being followed by the Guide Program. Patient's son states that patient has been assessed and is now being followed by the Guide Program. Patient has no additional needs at this time.   Cederic Mozley, LCSW [place]  [facility], [facility] Licensed Clinical Social Worker  Direct Dial: [phone]

## 2023-12-28 NOTE — Patient Instructions (Signed)
 Visit Information  Thank you for taking time to visit with me today. Please don't hesitate to contact me if I can be of assistance to you.  Patient has met all care management goals. Care Management case will be closed. Patient has been provided contact information should new needs arise.   Please call the care guide team at [phone] if you need to cancel, schedule, or reschedule an appointment.   Please call 911 if you are experiencing a Mental Health or Behavioral Health Crisis   [place], LCSW [place]  [facility], [facility] Licensed Clinical Social Worker  Direct Dial: [phone]

## 2024-01-20 ENCOUNTER — Other Ambulatory Visit: Payer: Self-pay

## 2024-01-20 ENCOUNTER — Telehealth: Payer: Self-pay

## 2024-01-20 DIAGNOSIS — G8929 Other chronic pain: Secondary | ICD-10-CM

## 2024-01-20 NOTE — Telephone Encounter (Signed)
 Pt. Son was in the office requesting a hip x-ray. Please call the son when a decision is made.

## 2024-01-25 ENCOUNTER — Ambulatory Visit
Admission: RE | Admit: 2024-01-25 | Discharge: 2024-01-25 | Disposition: A | Discharge status: Home / Self Care | Source: Ambulatory Visit

## 2024-01-25 DIAGNOSIS — G8929 Other chronic pain: Secondary | ICD-10-CM | POA: Insufficient documentation

## 2024-01-25 DIAGNOSIS — M25551 Pain in right hip: Secondary | ICD-10-CM | POA: Insufficient documentation

## 2024-01-26 ENCOUNTER — Ambulatory Visit: Payer: Self-pay

## 2024-01-31 ENCOUNTER — Telehealth (INDEPENDENT_AMBULATORY_CARE_PROVIDER_SITE_OTHER)

## 2024-01-31 DIAGNOSIS — F03C0 Unspecified dementia, severe, without behavioral disturbance, psychotic disturbance, mood disturbance, and anxiety: Secondary | ICD-10-CM

## 2024-01-31 DIAGNOSIS — M25551 Pain in right hip: Secondary | ICD-10-CM | POA: Diagnosis not present

## 2024-01-31 DIAGNOSIS — G8929 Other chronic pain: Secondary | ICD-10-CM

## 2024-01-31 NOTE — Progress Notes (Signed)
            Progress Note  Physician: Lebert Lovern A Quetzali Heinle, MD   Patient contacted 01/31/24 at 10:40 AM EST by a video enabled telemedicine application and verified that I am speaking with the correct person.   Patient is aware of limitations of evaluation by telemedicine The patient expressed understanding and agreed to proceed.   HPI: [name] is a 83 y.o. female presenting on 01/31/2024 for No chief complaint on file. .Discussed the use of AI scribe software for clinical note transcription with the patient, who gave verbal consent to proceed.  History of Present Illness   [name] is an 83 year old female with severe hip arthritis who presents with hip pain exacerbated by prolonged sitting.  Hip pain - Severe hip pain exacerbated by prolonged sitting and upon standing after sitting for extended periods - Pain significantly impacts daily activities - Recent hip x-ray performed showing severe osteoarthritis  Cognitive decline - Progressive memory impairment resulting in episodes of forgetting she is in pain - Cognitive deficits contribute to challenges with personal hygiene, including forgetting to shower or change clothes - Memory issues may contribute to episodes of agitation, potentially related to both pain and cognitive impairment     Social history:  Relevant past medical, surgical, family and social history reviewed and updated as indicated. Interim medical history since our last visit reviewed.  Allergies and medications reviewed and updated.  DATA REVIEWED: CHART IN EPIC  ROS: Negative unless specifically indicated above in HPI.    Current Outpatient Medications:    levothyroxine  (SYNTHROID ) 100 MCG tablet, Take 1 tablet (100 mcg total) by mouth daily before breakfast., Disp: 90 tablet, Rfl: 3      Objective:  Telephone visit     Assessment & Plan:  Severe dementia, unspecified dementia type, unspecified whether behavioral, psychotic, or mood  disturbance or anxiety (HCC)  Hip pain, chronic, right   Assessment and Plan    Severe osteoarthritis of hip   Severe osteoarthritis presents with pain management challenges due to age and memory issues.  Use Tylenol  for pain management and avoid NSAIDs. Monitor for pain or frustration.  F/u if additional concerns.   Severe dementia  Severe dementia is characterized by memory impairment and potential neglect of personal hygiene. Adopt an easy approach for agitation and memory issues. Monitor for increased agitation or memory decline.        No follow-ups on file.

## 2024-03-19 ENCOUNTER — Ambulatory Visit: Payer: Self-pay

## 2024-03-19 NOTE — Telephone Encounter (Signed)
 FYI Only or Action Required?: Action required by provider: clinical question for provider and update on patient condition.  Patient was last seen in primary care on 01/31/2024 by Zafirov, Clarissa A, MD.  Called Nurse Triage reporting Influenza.  Triage Disposition: Call PCP Within 24 Hours  Patient/caregiver understands and will follow disposition?: Yes   Reason for Disposition  [1] Influenza EXPOSURE (Close Contact) within last 48 hours (2 days) AND [2] exposed person is HIGH RISK (e.g., 65 years and older, pregnant, HIV+, chronic medical condition)  Answer Assessment - Initial Assessment Questions 1. TYPE of EXPOSURE: How were you exposed? (e.g., close contact, not a close contact)     Pt's daughter in law contacted clinic reporting she tested positive for the flu and is on day 2 of tamiflu. Pt's son is now experiencing flu symptoms with fever of 102F. Pt's family very worried about her as pt lives with them and they report she is immunocompromised. Brandy, DIL, unsure if anything preventative can be sent for pt. She can be contacted at [phone]. Please advise.  Protocols used: Influenza (Flu) Exposure-A-AH

## 2024-10-15 ENCOUNTER — Encounter: Admitting: Family Medicine
